# Patient Record
Sex: Male | Born: 1940 | Race: White | Hispanic: No | Marital: Married | Smoking: Former smoker
Health system: Southern US, Community
[De-identification: ages and names within clinical notes are randomized; demographics above are authoritative.]

## PROBLEM LIST (undated history)

## (undated) DIAGNOSIS — A0472 Enterocolitis due to Clostridium difficile, not specified as recurrent: Secondary | ICD-10-CM

## (undated) DIAGNOSIS — E785 Hyperlipidemia, unspecified: Secondary | ICD-10-CM

## (undated) DIAGNOSIS — K219 Gastro-esophageal reflux disease without esophagitis: Secondary | ICD-10-CM

## (undated) DIAGNOSIS — F321 Major depressive disorder, single episode, moderate: Secondary | ICD-10-CM

## (undated) HISTORY — DX: Enterocolitis due to Clostridium difficile, not specified as recurrent: A04.72

## (undated) HISTORY — DX: Hyperlipidemia, unspecified: E78.5

## (undated) HISTORY — DX: Major depressive disorder, single episode, moderate: F32.1

## (undated) HISTORY — DX: Gastro-esophageal reflux disease without esophagitis: K21.9

## (undated) HISTORY — PX: PROSTATECTOMY: SHX69

## (undated) HISTORY — PX: OTHER SURGICAL HISTORY: SHX169

---

## 2012-11-24 ENCOUNTER — Ambulatory Visit (INDEPENDENT_AMBULATORY_CARE_PROVIDER_SITE_OTHER): Payer: BC Managed Care – PPO | Admitting: Sports Medicine

## 2012-11-24 ENCOUNTER — Encounter: Payer: Self-pay | Admitting: Sports Medicine

## 2012-11-24 VITALS — BP 125/76 | HR 74 | Wt 197.0 lb

## 2012-11-24 DIAGNOSIS — Z23 Encounter for immunization: Secondary | ICD-10-CM

## 2012-11-24 DIAGNOSIS — G4700 Insomnia, unspecified: Secondary | ICD-10-CM

## 2012-11-24 DIAGNOSIS — Z299 Encounter for prophylactic measures, unspecified: Secondary | ICD-10-CM

## 2012-11-24 DIAGNOSIS — Z9079 Acquired absence of other genital organ(s): Secondary | ICD-10-CM

## 2012-11-24 DIAGNOSIS — Z0000 Encounter for general adult medical examination without abnormal findings: Secondary | ICD-10-CM | POA: Insufficient documentation

## 2012-11-24 DIAGNOSIS — N139 Obstructive and reflux uropathy, unspecified: Secondary | ICD-10-CM | POA: Insufficient documentation

## 2012-11-24 DIAGNOSIS — E785 Hyperlipidemia, unspecified: Secondary | ICD-10-CM

## 2012-11-24 DIAGNOSIS — L219 Seborrheic dermatitis, unspecified: Secondary | ICD-10-CM

## 2012-11-24 MED ORDER — TAMSULOSIN HCL 0.4 MG PO CAPS: 0.4000 mg | ORAL_CAPSULE | Freq: Every day | ORAL | Status: DC

## 2012-11-24 MED ORDER — TRIAMCINOLONE ACETONIDE 0.5 % EX CREA: TOPICAL_CREAM | Freq: Two times a day (BID) | CUTANEOUS | Status: DC

## 2012-11-24 MED ORDER — ZOLPIDEM TARTRATE 10 MG PO TABS: 10.0000 mg | ORAL_TABLET | Freq: Every evening | ORAL | Status: DC | PRN

## 2012-11-24 MED ORDER — TADALAFIL 5 MG PO TABS: 5.0000 mg | ORAL_TABLET | Freq: Every day | ORAL | Status: DC

## 2012-11-24 NOTE — Assessment & Plan Note (Addendum)
Now with some erectile dysfunction, and obstructive uropathy. Adding Flomax, checking PSA. Prescription for Cialis with discount coupon given. Return in one month.

## 2012-11-24 NOTE — Progress Notes (Signed)
  Subjective:    CC: Establish care.   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  Rash on face: Pruritic, localized over the eyebrows, and nasolabial folds. Moderate, persistent.  Insomnia: Well controlled in the past with Ambien, his prior PCP would not prescribe this. He is aware of the risks and benefits of the use of Ambien in an older individual. He does continue to desire to proceed.  Nocturia: Difficult stream, dribbling, poor bladder routine, and getting up multiple times through the night to urinate.  History of radical prostatectomy: surgical cure per patient report of his urologist's assessment. Still has some difficulty achieving erections.  Preventive measure: Desires flu, tetanus, pneumococcal, and shingles vaccine today.  Past medical history, Surgical history, Family history not pertinant except as noted below, Social history, Allergies, and medications have been entered into the medical record, reviewed, and no changes needed.   Review of Systems: No headache, visual changes, nausea, vomiting, diarrhea, constipation, dizziness, abdominal pain, skin rash, fevers, chills, night sweats, swollen lymph nodes, weight loss, chest pain, body aches, joint swelling, muscle aches, shortness of breath, mood changes, visual or auditory hallucinations.  Objective:    General: Well Developed, well nourished, and in no acute distress.  Neuro: Alert and oriented x3, extra-ocular muscles intact, sensation grossly intact.  HEENT: Normocephalic, atraumatic, pupils equal round reactive to light, neck supple, no masses, no lymphadenopathy, thyroid nonpalpable.  Skin: Warm and dry, seborrheic dermatitis noted over the forehead. Cardiac: Regular rate and rhythm, no murmurs rubs or gallops.  Respiratory: Clear to auscultation bilaterally. Not using  accessory muscles, speaking in full sentences.  Abdominal: Soft, nontender, nondistended, positive bowel sounds, no masses, no organomegaly.  Musculoskeletal: Shoulder, elbow, wrist, hip, knee, ankle stable, and with full range of motion.  Impression and Recommendations:    The patient was counselled, risk factors were discussed, anticipatory guidance given.

## 2012-11-24 NOTE — Assessment & Plan Note (Signed)
Restarting Ambien. 

## 2012-11-24 NOTE — Assessment & Plan Note (Signed)
Tetanus, flu, pneumonia, shingles vaccine was given today.

## 2012-11-24 NOTE — Assessment & Plan Note (Signed)
Present on forehead, and around eyes. Kenalog cream. Return as needed for this.

## 2012-11-24 NOTE — Assessment & Plan Note (Signed)
Rechecking lipids, it sounds as from his history he has a familial form of hyperlipidemia.

## 2012-11-25 LAB — TESTOSTERONE, FREE, TOTAL, SHBG
Sex Hormone Binding: 87 nmol/L — ABNORMAL HIGH (ref 13–71)
Testosterone, Free: 87.8 pg/mL (ref 47.0–244.0)
Testosterone-% Free: 1.1 % — ABNORMAL LOW (ref 1.6–2.9)
Testosterone: 792 ng/dL (ref 300–890)

## 2012-11-25 LAB — CBC
HCT: 41.4 % (ref 39.0–52.0)
Hemoglobin: 14.5 g/dL (ref 13.0–17.0)
MCH: 36.4 pg — ABNORMAL HIGH (ref 26.0–34.0)
MCHC: 35 g/dL (ref 30.0–36.0)
MCV: 104 fL — ABNORMAL HIGH (ref 78.0–100.0)
Platelets: 261 K/uL (ref 150–400)
RBC: 3.98 MIL/uL — ABNORMAL LOW (ref 4.22–5.81)
RDW: 14.6 % (ref 11.5–15.5)
WBC: 3.9 K/uL — ABNORMAL LOW (ref 4.0–10.5)

## 2012-11-25 LAB — LIPID PANEL
Cholesterol: 149 mg/dL (ref 0–200)
HDL: 54 mg/dL (ref >39)
LDL Cholesterol: 69 mg/dL (ref 0–99)
Total CHOL/HDL Ratio: 2.8 ratio
Triglycerides: 130 mg/dL (ref <150)
VLDL: 26 mg/dL (ref 0–40)

## 2012-11-25 LAB — COMPREHENSIVE METABOLIC PANEL WITH GFR
ALT: 14 U/L (ref 0–53)
CO2: 27 meq/L (ref 19–32)
Calcium: 9.4 mg/dL (ref 8.4–10.5)
Chloride: 105 meq/L (ref 96–112)
Sodium: 140 meq/L (ref 135–145)
Total Bilirubin: 0.8 mg/dL (ref 0.3–1.2)
Total Protein: 6.9 g/dL (ref 6.0–8.3)

## 2012-11-25 LAB — PSA, TOTAL AND FREE
PSA, Free: <0.01 ng/mL
PSA: <0.01 ng/mL

## 2012-11-25 LAB — COMPREHENSIVE METABOLIC PANEL
AST: 19 U/L (ref 0–37)
Albumin: 4.3 g/dL (ref 3.5–5.2)
Alkaline Phosphatase: 85 U/L (ref 39–117)
BUN: 11 mg/dL (ref 6–23)
Creat: 0.85 mg/dL (ref 0.50–1.35)
Glucose, Bld: 88 mg/dL (ref 70–99)
Potassium: 4.6 mEq/L (ref 3.5–5.3)

## 2012-12-26 ENCOUNTER — Ambulatory Visit (INDEPENDENT_AMBULATORY_CARE_PROVIDER_SITE_OTHER): Payer: BC Managed Care – PPO | Admitting: Sports Medicine

## 2012-12-26 ENCOUNTER — Encounter: Payer: Self-pay | Admitting: Sports Medicine

## 2012-12-26 VITALS — BP 118/63 | HR 87 | Wt 192.0 lb

## 2012-12-26 DIAGNOSIS — Z9079 Acquired absence of other genital organ(s): Secondary | ICD-10-CM

## 2012-12-26 DIAGNOSIS — E785 Hyperlipidemia, unspecified: Secondary | ICD-10-CM

## 2012-12-26 DIAGNOSIS — L219 Seborrheic dermatitis, unspecified: Secondary | ICD-10-CM

## 2012-12-26 DIAGNOSIS — G4700 Insomnia, unspecified: Secondary | ICD-10-CM

## 2012-12-26 MED ORDER — ZOLPIDEM TARTRATE 10 MG PO TABS: 10.0000 mg | ORAL_TABLET | Freq: Every evening | ORAL | Status: DC | PRN

## 2012-12-26 MED ORDER — GEMFIBROZIL 600 MG PO TABS: 600.0000 mg | ORAL_TABLET | Freq: Two times a day (BID) | ORAL | Status: DC

## 2012-12-26 MED ORDER — TAMSULOSIN HCL 0.4 MG PO CAPS: 0.8000 mg | ORAL_CAPSULE | Freq: Every day | ORAL | Status: DC

## 2012-12-26 NOTE — Assessment & Plan Note (Signed)
Zero response to Cialis. Increasing Flomax to 2 tabs at dinner time.

## 2012-12-26 NOTE — Assessment & Plan Note (Signed)
Refilling this.

## 2012-12-26 NOTE — Assessment & Plan Note (Signed)
Resolved with steroid cream.

## 2012-12-26 NOTE — Progress Notes (Signed)
  Subjective:    CC: Followup  HPI: Insomnia: Well controlled.  Obstructive uropathy:  No response to Cialis, still doing okay with occasional Flomax one tab in the evening.  Seborrheic dermatitis: Resolved with steroid cream.  Past medical history, Surgical history, Family history not pertinant except as noted below, Social history, Allergies, and medications have been entered into the medical record, reviewed, and no changes needed.   Review of Systems: No fevers, chills, night sweats, weight loss, chest pain, or shortness of breath.   Objective:    General: Well Developed, well nourished, and in no acute distress.  Neuro: Alert and oriented x3, extra-ocular muscles intact, sensation grossly intact.  HEENT: Normocephalic, atraumatic, pupils equal round reactive to light, neck supple, no masses, no lymphadenopathy, thyroid nonpalpable.  Skin: Warm and dry, no rashes. Cardiac: Regular rate and rhythm, no murmurs rubs or gallops, no lower extremity edema.  Respiratory: Clear to auscultation bilaterally. Not using accessory muscles, speaking in full sentences.  Impression and Recommendations:

## 2012-12-26 NOTE — Assessment & Plan Note (Signed)
Refilling Ambien. He needs 30 pills for now, and an additional 3 months sent to [facility].

## 2013-01-16 ENCOUNTER — Other Ambulatory Visit: Payer: Self-pay | Admitting: Sports Medicine

## 2013-03-06 ENCOUNTER — Ambulatory Visit (INDEPENDENT_AMBULATORY_CARE_PROVIDER_SITE_OTHER): Payer: BC Managed Care – PPO | Admitting: Sports Medicine

## 2013-03-06 ENCOUNTER — Encounter: Payer: Self-pay | Admitting: Sports Medicine

## 2013-03-06 VITALS — BP 132/72 | HR 78 | Ht 73.0 in | Wt 198.0 lb

## 2013-03-06 DIAGNOSIS — C44212 Basal cell carcinoma of skin of right ear and external auricular canal: Secondary | ICD-10-CM | POA: Insufficient documentation

## 2013-03-06 DIAGNOSIS — L989 Disorder of the skin and subcutaneous tissue, unspecified: Secondary | ICD-10-CM

## 2013-03-06 DIAGNOSIS — J0100 Acute maxillary sinusitis, unspecified: Secondary | ICD-10-CM | POA: Insufficient documentation

## 2013-03-06 MED ORDER — DOXYCYCLINE HYCLATE 100 MG PO TABS: 100.0000 mg | ORAL_TABLET | Freq: Two times a day (BID) | ORAL | Status: DC

## 2013-03-06 MED ORDER — FLUTICASONE PROPIONATE 50 MCG/ACT NA SUSP: NASAL | Status: DC

## 2013-03-06 NOTE — Assessment & Plan Note (Signed)
This occurred while gardening, he thinks he was poked by a thorn. Clinically this resembles a cellulitis. Doxycycline should cover his sinus infection and skin and soft tissue infection. We certainly do need to keep a rare fungal etiologies in our head such as sporotrichosis. If persistent we will probably pursue excisional biopsy.

## 2013-03-06 NOTE — Progress Notes (Signed)
  Subjective:    CC: Lesion on arm  HPI: This is a very pleasant 59 her old male, a week ago he was doing some gardening, thinks he got stuck with a poor, and developed a painful, reddish lesion on the dorsum of his left forearm. Symptoms are fairly persistent. No fevers, chills, no constitutional symptoms, no other complaints. Pain is moderate, no radiation.  Sinus infection: Increasing pressure and pain over the maxillary sinuses with radiation to the left ear.  Past medical history, Surgical history, Family history not pertinant except as noted below, Social history, Allergies, and medications have been entered into the medical record, reviewed, and no changes needed.   Review of Systems: No fevers, chills, night sweats, weight loss, chest pain, or shortness of breath.   Objective:    General: Well Developed, well nourished, and in no acute distress.  Neuro: Alert and oriented x3, extra-ocular muscles intact, sensation grossly intact.  HEENT: Normocephalic, atraumatic, pupils equal round reactive to light, neck supple, no masses, no lymphadenopathy, thyroid nonpalpable. Oropharynx, nasopharynx, external ear canals are unremarkable. Skin: Warm and dry, no rashes. There is a 1 cm papule with surrounding erythema, induration, and tenderness to palpation. This is localized on the left dorsal forearm Cardiac: Regular rate and rhythm, no murmurs rubs or gallops, no lower extremity edema.  Respiratory: Clear to auscultation bilaterally. Not using accessory muscles, speaking in full sentences.  Impression and Recommendations:

## 2013-03-06 NOTE — Assessment & Plan Note (Signed)
Doxycycline, Flonase.  

## 2013-03-13 ENCOUNTER — Telehealth: Payer: Self-pay

## 2013-03-13 ENCOUNTER — Ambulatory Visit (INDEPENDENT_AMBULATORY_CARE_PROVIDER_SITE_OTHER): Payer: BC Managed Care – PPO | Admitting: Sports Medicine

## 2013-03-13 ENCOUNTER — Encounter: Payer: Self-pay | Admitting: Sports Medicine

## 2013-03-13 VITALS — BP 115/70 | HR 69 | Ht 73.0 in | Wt 195.0 lb

## 2013-03-13 DIAGNOSIS — L989 Disorder of the skin and subcutaneous tissue, unspecified: Secondary | ICD-10-CM

## 2013-03-13 DIAGNOSIS — R21 Rash and other nonspecific skin eruption: Secondary | ICD-10-CM

## 2013-03-13 MED ORDER — HYDROCODONE-ACETAMINOPHEN 5-325 MG PO TABS: 1.0000 | ORAL_TABLET | Freq: Three times a day (TID) | ORAL | Status: DC | PRN

## 2013-03-13 NOTE — Addendum Note (Signed)
Addended by: Silverio Decamp on: 03/13/2013 02:52 PM   Modules accepted: Orders

## 2013-03-13 NOTE — Progress Notes (Signed)
  Subjective:    CC: Followup  HPI: Acute sinusitis: Resolved.  Skin rash: On left arm, improved, he still does have a scaly lesion over the dorsum of the forearm.  Past medical history, Surgical history, Family history not pertinant except as noted below, Social history, Allergies, and medications have been entered into the medical record, reviewed, and no changes needed.   Review of Systems: No fevers, chills, night sweats, weight loss, chest pain, or shortness of breath.   Objective:    General: Well Developed, well nourished, and in no acute distress.  Neuro: Alert and oriented x3, extra-ocular muscles intact, sensation grossly intact.  HEENT: Normocephalic, atraumatic, pupils equal round reactive to light, neck supple, no masses, no lymphadenopathy, thyroid nonpalpable.  Skin: Warm and dry, there is a 1.1 cm scaly lesion over the dorsum of the left forearm. Cardiac: Regular rate and rhythm, no murmurs rubs or gallops, no lower extremity edema.  Respiratory: Clear to auscultation bilaterally. Not using accessory muscles, speaking in full sentences.  Procedure:  Excision of  left forearm skin lesion, possibly malignant Risks, benefits, and alternatives explained and consent obtained. Time out conducted. Surface prepped with alcohol. 5cc lidocaine with epinephine infiltrated in a field block. Adequate anesthesia ensured. Area prepped and draped in a sterile fashion. Excision performed with: An elliptical incision was made around the lesion leaving a 0.5 cm margin on either side. The lesion was fully excised down to the subcutaneous tissue, it was sent off for pathology, I did place a single suture on the ulnar aspect. It includes the incision with a 4-0 Ethilon horizontal mattress to reduce tension across the edges, I didn't at least 4-0 Vicryl sutures in a running subcuticular pattern. Hemostasis achieved. Pt stable.  Impression and Recommendations:    I spent 40 minutes with  this patient, greater than 50% was face-to-face time counseling regarding the above diagnosis.

## 2013-03-13 NOTE — Assessment & Plan Note (Signed)
Erythema did improve with doxycycline however the lesion still is present. I do have a concern for squamous cell carcinoma. Excisional biopsy performed. Patient will return in one week for removal of the horizontal mattress reinforcing suture.

## 2013-03-13 NOTE — Telephone Encounter (Signed)
Prescription is on your desk.

## 2013-03-13 NOTE — Telephone Encounter (Signed)
Patient was in office today for a biopsy, he is requesting a pain medication sent to his pharmacy. Irais Mottram,CMA

## 2013-03-20 ENCOUNTER — Ambulatory Visit (INDEPENDENT_AMBULATORY_CARE_PROVIDER_SITE_OTHER): Payer: BC Managed Care – PPO | Admitting: Sports Medicine

## 2013-03-20 ENCOUNTER — Encounter: Payer: Self-pay | Admitting: Sports Medicine

## 2013-03-20 VITALS — BP 118/71 | HR 70 | Ht 73.0 in | Wt 198.0 lb

## 2013-03-20 DIAGNOSIS — L989 Disorder of the skin and subcutaneous tissue, unspecified: Secondary | ICD-10-CM

## 2013-03-20 MED ORDER — DOXYCYCLINE HYCLATE 100 MG PO TABS: 100.0000 mg | ORAL_TABLET | Freq: Two times a day (BID) | ORAL | Status: AC

## 2013-03-20 NOTE — Progress Notes (Signed)
  Subjective:    CC:  Wound check  HPI: Approximately one week ago I removed a lesion from Christian Lambert's left forearm, I placed a horizontal mattress suture to remove tension on the wound, I then approximated the edges of the incision with a running subcuticular stitch. Overall he feels good, pathology results will be dictated below.  Past medical history, Surgical history, Family history not pertinant except as noted below, Social history, Allergies, and medications have been entered into the medical record, reviewed, and no changes needed.   Review of Systems: No fevers, chills, night sweats, weight loss, chest pain, or shortness of breath.   Objective:    General: Well Developed, well nourished, and in no acute distress.  Neuro: Alert and oriented x3, extra-ocular muscles intact, sensation grossly intact.  HEENT: Normocephalic, atraumatic, pupils equal round reactive to light, neck supple, no masses, no lymphadenopathy, thyroid nonpalpable.  Skin: Warm and dry, no rashes. Unfortunately there does appear to be some dehiscence of the wound, there is mild erythema but no exudates or overt purulence. I cleaned the wound extensively and debrided any necrotic tissue, I then removed the horizontal mattress suture, I then placed Steri-Strips to further approximate the edges of.  Cardiac: Regular rate and rhythm, no murmurs rubs or gallops, no lower extremity edema.  Respiratory: Clear to auscultation bilaterally. Not using accessory muscles, speaking in full sentences.  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  Impression and Recommendations:

## 2013-03-20 NOTE — Assessment & Plan Note (Signed)
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 The wound does show a very small amount of dehiscence. I placed Steri-Strips and divided the wound is necrotic tissue. He'll come back to see me in one week, I am giving him a single additional course of doxycycline as there was mild erythema.

## 2013-03-28 ENCOUNTER — Ambulatory Visit (INDEPENDENT_AMBULATORY_CARE_PROVIDER_SITE_OTHER): Payer: BC Managed Care – PPO | Admitting: Sports Medicine

## 2013-03-28 ENCOUNTER — Encounter: Payer: Self-pay | Admitting: Sports Medicine

## 2013-03-28 VITALS — BP 103/62 | HR 65 | Temp 97.9°F | Ht 73.0 in | Wt 199.0 lb

## 2013-03-28 DIAGNOSIS — L989 Disorder of the skin and subcutaneous tissue, unspecified: Secondary | ICD-10-CM

## 2013-03-28 NOTE — Assessment & Plan Note (Signed)
Doing well post excision. Return as needed.

## 2013-03-28 NOTE — Progress Notes (Signed)
  Subjective:    CC: Wound check  HPI: Excisional biopsy performed, results of the being chronic folliculitis and nothing malignant. He returned he did have some dehiscence of the wound and erythema suggestive of mild infection. I debrided the wound, placed Steri-Strips, and provided an antibiotic. He returns today with wound completely healed and symptoms totally resolved.  Past medical history, Surgical history, Family history not pertinant except as noted below, Social history, Allergies, and medications have been entered into the medical record, reviewed, and no changes needed.   Review of Systems: No fevers, chills, night sweats, weight loss, chest pain, or shortness of breath.   Objective:    General: Well Developed, well nourished, and in no acute distress.  Neuro: Alert and oriented x3, extra-ocular muscles intact, sensation grossly intact.  HEENT: Normocephalic, atraumatic, pupils equal round reactive to light, neck supple, no masses, no lymphadenopathy, thyroid nonpalpable.  Skin: Warm and dry, no rashes. Incision is clean, dry, intact, and well-healed. Cardiac: Regular rate and rhythm, no murmurs rubs or gallops, no lower extremity edema.  Respiratory: Clear to auscultation bilaterally. Not using accessory muscles, speaking in full sentences.  Impression and Recommendations:

## 2013-07-21 ENCOUNTER — Other Ambulatory Visit: Payer: Self-pay | Admitting: Sports Medicine

## 2013-07-21 ENCOUNTER — Other Ambulatory Visit: Payer: Self-pay

## 2013-08-14 ENCOUNTER — Encounter: Payer: Self-pay | Admitting: Sports Medicine

## 2013-08-14 ENCOUNTER — Ambulatory Visit (INDEPENDENT_AMBULATORY_CARE_PROVIDER_SITE_OTHER): Payer: BC Managed Care – PPO | Admitting: Sports Medicine

## 2013-08-14 VITALS — BP 118/75 | HR 81 | Ht 73.0 in | Wt 189.0 lb

## 2013-08-14 DIAGNOSIS — Z299 Encounter for prophylactic measures, unspecified: Secondary | ICD-10-CM

## 2013-08-14 DIAGNOSIS — G4700 Insomnia, unspecified: Secondary | ICD-10-CM

## 2013-08-14 DIAGNOSIS — IMO0002 Reserved for concepts with insufficient information to code with codable children: Secondary | ICD-10-CM

## 2013-08-14 DIAGNOSIS — S76312A Strain of muscle, fascia and tendon of the posterior muscle group at thigh level, left thigh, initial encounter: Secondary | ICD-10-CM | POA: Insufficient documentation

## 2013-08-14 DIAGNOSIS — D7589 Other specified diseases of blood and blood-forming organs: Secondary | ICD-10-CM

## 2013-08-14 MED ORDER — ZOLPIDEM TARTRATE 10 MG PO TABS: 10.0000 mg | ORAL_TABLET | Freq: Every day | ORAL | Status: DC

## 2013-08-14 MED ORDER — MELOXICAM 15 MG PO TABS: ORAL_TABLET | ORAL | Status: DC

## 2013-08-14 NOTE — Assessment & Plan Note (Addendum)
Mobic, formal physical therapy, strapped with compressive dressing. As there is a slight concern for DVT, we are going to obtain a d-dimer. Return in one month.

## 2013-08-14 NOTE — Assessment & Plan Note (Signed)
Needs refill on Ambien  

## 2013-08-14 NOTE — Progress Notes (Signed)
  Subjective:    CC: Left thigh pain  HPI: Christian Lambert is a very pleasant 73 year old male, he drives a truck, for the past several weeks he's had pain he localizes on the posterior aspect of his left thigh, moderate, persistent, pain is localized without radiation. He denies any trauma. His principle concern is that he has a blood clot, he denies any lower extremity swelling, chest pain, or shortness of breath. No clearly radicular symptoms, no back pain.  Past medical history, Surgical history, Family history not pertinant except as noted below, Social history, Allergies, and medications have been entered into the medical record, reviewed, and no changes needed.   Review of Systems: No fevers, chills, night sweats, weight loss, chest pain, or shortness of breath.   Objective:    General: Well Developed, well nourished, and in no acute distress.  Neuro: Alert and oriented x3, extra-ocular muscles intact, sensation grossly intact.  HEENT: Normocephalic, atraumatic, pupils equal round reactive to light, neck supple, no masses, no lymphadenopathy, thyroid nonpalpable.  Skin: Warm and dry, no rashes. Cardiac: Regular rate and rhythm, no murmurs rubs or gallops, no lower extremity edema.  Respiratory: Clear to auscultation bilaterally. Not using accessory muscles, speaking in full sentences. Left Hip: ROM IR: 60 Deg, ER: 60 Deg, Flexion: 120 Deg, Extension: 100 Deg, Abduction: 45 Deg, Adduction: 45 Deg Strength IR: 5/5, ER: 5/5, Flexion: 5/5, Extension: 5/5, Abduction: 5/5, Adduction: 5/5 Pelvic alignment unremarkable to inspection and palpation. Standing hip rotation and gait without trendelenburg / unsteadiness. Greater trochanter without tenderness to palpation. No tenderness over piriformis. No SI joint tenderness and normal minimal SI movement. Discrete tenderness to palpation over the semimembranosus and semitendinosus muscle bellies, with reproduction of pain with resisted knee  flexion.  The entire thigh and knee were strapped with compressive dressing. This resolved his pain.  Impression and Recommendations:

## 2013-08-14 NOTE — Assessment & Plan Note (Signed)
Adding routine blood work. 

## 2013-08-15 LAB — TSH: TSH: 1.057 u[IU]/mL (ref 0.350–4.500)

## 2013-08-15 LAB — COMPREHENSIVE METABOLIC PANEL
ALT: 15 U/L (ref 0–53)
Albumin: 4.4 g/dL (ref 3.5–5.2)
Alkaline Phosphatase: 78 U/L (ref 39–117)
BUN: 13 mg/dL (ref 6–23)
CO2: 24 mEq/L (ref 19–32)
Calcium: 9.2 mg/dL (ref 8.4–10.5)
Chloride: 108 mEq/L (ref 96–112)
Creat: 0.85 mg/dL (ref 0.50–1.35)
Glucose, Bld: 91 mg/dL (ref 70–99)
Potassium: 4.1 mEq/L (ref 3.5–5.3)
Sodium: 141 mEq/L (ref 135–145)
Total Bilirubin: 0.9 mg/dL (ref 0.2–1.2)
Total Protein: 6.6 g/dL (ref 6.0–8.3)

## 2013-08-15 LAB — CBC
HCT: 41 % (ref 39.0–52.0)
Hemoglobin: 14.2 g/dL (ref 13.0–17.0)
MCH: 35.9 pg — ABNORMAL HIGH (ref 26.0–34.0)
MCHC: 34.6 g/dL (ref 30.0–36.0)
MCV: 103.5 fL — ABNORMAL HIGH (ref 78.0–100.0)
Platelets: 222 10*3/uL (ref 150–400)
RBC: 3.96 MIL/uL — ABNORMAL LOW (ref 4.22–5.81)
RDW: 15.7 % — ABNORMAL HIGH (ref 11.5–15.5)
WBC: 4.5 10*3/uL (ref 4.0–10.5)

## 2013-08-15 LAB — LIPID PANEL
Cholesterol: 137 mg/dL (ref 0–200)
HDL: 57 mg/dL (ref >39)
LDL Cholesterol: 67 mg/dL (ref 0–99)
Total CHOL/HDL Ratio: 2.4 Ratio
Triglycerides: 67 mg/dL (ref <150)
VLDL: 13 mg/dL (ref 0–40)

## 2013-08-15 LAB — D-DIMER, QUANTITATIVE: D-Dimer, Quant: 0.32 ug{FEU}/mL (ref 0.00–0.48)

## 2013-08-15 LAB — COMPREHENSIVE METABOLIC PANEL WITH GFR: AST: 19 U/L (ref 0–37)

## 2013-08-15 LAB — HEMOGLOBIN A1C
Hgb A1c MFr Bld: 5.2 % (ref <5.7)
Mean Plasma Glucose: 103 mg/dL (ref <117)

## 2013-08-15 NOTE — Addendum Note (Signed)
Addended by: Silverio Decamp on: 08/15/2013 10:51 AM   Modules accepted: Orders

## 2013-08-28 ENCOUNTER — Ambulatory Visit (INDEPENDENT_AMBULATORY_CARE_PROVIDER_SITE_OTHER): Payer: BC Managed Care – PPO | Admitting: Physical Therapy

## 2013-08-28 DIAGNOSIS — M25569 Pain in unspecified knee: Secondary | ICD-10-CM

## 2013-08-28 DIAGNOSIS — M6281 Muscle weakness (generalized): Secondary | ICD-10-CM

## 2013-08-28 DIAGNOSIS — IMO0002 Reserved for concepts with insufficient information to code with codable children: Secondary | ICD-10-CM

## 2013-09-11 ENCOUNTER — Ambulatory Visit: Payer: BC Managed Care – PPO | Admitting: Sports Medicine

## 2013-09-14 ENCOUNTER — Ambulatory Visit (INDEPENDENT_AMBULATORY_CARE_PROVIDER_SITE_OTHER): Payer: BC Managed Care – PPO | Admitting: Family Medicine

## 2013-09-14 ENCOUNTER — Encounter: Payer: Self-pay | Admitting: Family Medicine

## 2013-09-14 VITALS — BP 118/75 | HR 73 | Temp 98.1°F | Wt 189.0 lb

## 2013-09-14 DIAGNOSIS — W5501XA Bitten by cat, initial encounter: Secondary | ICD-10-CM

## 2013-09-14 DIAGNOSIS — IMO0001 Reserved for inherently not codable concepts without codable children: Secondary | ICD-10-CM

## 2013-09-14 DIAGNOSIS — T148XXA Other injury of unspecified body region, initial encounter: Secondary | ICD-10-CM

## 2013-09-14 MED ORDER — AMOXICILLIN-POT CLAVULANATE 500-125 MG PO TABS
ORAL_TABLET | ORAL | Status: AC
Start: 2013-09-14 — End: 2013-09-24

## 2013-09-14 NOTE — Progress Notes (Signed)
CC: Christian Lambert is a 73 y.o. male is here for cat scratch   Subjective: HPI:  Complains of pain, redness, swelling in his right hand at the base of the thumb that began one day ago. History significant for cat bite that occurred 48 hours ago. This cath was his domesticated cat is up-to-date on shots and rabies vaccinations, the cat bit him while the patient was trying to put it in a transportation been. This report scant bleeding that was stopped within seconds after light pressure 48 hours ago. Pain is described as a soreness, warmth, and mild in severity that seems to be accompanied by a redness that is expanding. Overall he states that he is in his regular state of health without fevers, chills, swollen lymph nodes, rapid heart beat nor nausea.  Review Of Systems Outlined In HPI  Past Medical History  Diagnosis Date  . Hyperlipidemia     Past Surgical History  Procedure Laterality Date  . Prostatectomy    . Broken leg     Family History  Problem Relation Age of Onset  . Alcohol abuse Mother   . Cancer Mother   . Depression Mother   . Alcohol abuse Father   . Cancer Maternal Aunt   . Alcohol abuse Maternal Uncle   . Cancer Maternal Uncle   . Diabetes Maternal Grandmother     History   Social History  . Marital Status: Married    Spouse Name: N/A    Number of Children: N/A  . Years of Education: N/A   Occupational History  . Not on file.   Social History Main Topics  . Smoking status: Former Research scientist (life sciences)  . Smokeless tobacco: Not on file  . Alcohol Use: No  . Drug Use: Not on file  . Sexual Activity: Not on file   Other Topics Concern  . Not on file   Social History Narrative  . No narrative on file     Objective: BP 118/75  Pulse 73  Temp(Src) 98.1 F (36.7 C) (Oral)  Wt 189 lb (85.73 kg)  Vital signs reviewed. General: Alert and Oriented, No Acute Distress HEENT: Pupils equal, round, reactive to light. Conjunctivae clear.  External ears unremarkable.   Moist mucous membranes. Lungs: Clear and comfortable work of breathing, speaking in full sentences without accessory muscle use. Cardiac: Regular rate and rhythm.  Neuro: CN II-XII grossly intact, gait normal. Extremities: No peripheral edema.  Strong peripheral pulses.  Mental Status: No depression, anxiety, nor agitation. Logical though process. Skin: Warm and dry. At the base of the right thumb there is a nontender puncture wound in the thenar region and on the dorsal aspect there is another puncture wound with moderate erythema treating the periphery of 2 cm in diameter warm to the touch compared to surrounding skin.  Assessment & Plan: Christian Lambert was seen today for cat scratch.  Diagnoses and associated orders for this visit:  Cat bite, initial encounter - amoxicillin-clavulanate (AUGMENTIN) 500-125 MG per tablet; Take one by mouth every 8 hours for ten total days.    Cat bite: Tetanus is up-to-date, start Augmentin,Signs and symptoms requring emergent/urgent reevaluation were discussed with the patient. He requested that his recent B12, iron studies etc. be reprinted which I gave him today.  Return if symptoms worsen or fail to improve.

## 2013-09-15 LAB — IRON AND TIBC
%SAT: 56 % — ABNORMAL HIGH (ref 20–55)
Iron: 230 ug/dL — ABNORMAL HIGH (ref 42–165)
TIBC: 413 ug/dL (ref 215–435)
UIBC: 183 ug/dL (ref 125–400)

## 2013-09-15 LAB — VITAMIN B12: Vitamin B-12: 359 pg/mL (ref 211–911)

## 2013-09-15 LAB — FOLATE: Folate: >20 ng/mL

## 2013-09-15 LAB — FERRITIN: Ferritin: 213 ng/mL (ref 22–322)

## 2013-11-09 ENCOUNTER — Other Ambulatory Visit: Payer: Self-pay | Admitting: Sports Medicine

## 2013-11-09 ENCOUNTER — Encounter: Payer: Self-pay | Admitting: Sports Medicine

## 2013-11-09 ENCOUNTER — Ambulatory Visit (INDEPENDENT_AMBULATORY_CARE_PROVIDER_SITE_OTHER): Payer: BC Managed Care – PPO

## 2013-11-09 ENCOUNTER — Ambulatory Visit (INDEPENDENT_AMBULATORY_CARE_PROVIDER_SITE_OTHER): Payer: BC Managed Care – PPO | Admitting: Sports Medicine

## 2013-11-09 VITALS — BP 123/77 | HR 76 | Ht 73.0 in | Wt 187.0 lb

## 2013-11-09 DIAGNOSIS — G4700 Insomnia, unspecified: Secondary | ICD-10-CM | POA: Diagnosis not present

## 2013-11-09 DIAGNOSIS — Z418 Encounter for other procedures for purposes other than remedying health state: Secondary | ICD-10-CM | POA: Diagnosis not present

## 2013-11-09 DIAGNOSIS — R197 Diarrhea, unspecified: Secondary | ICD-10-CM

## 2013-11-09 DIAGNOSIS — A0472 Enterocolitis due to Clostridium difficile, not specified as recurrent: Secondary | ICD-10-CM | POA: Insufficient documentation

## 2013-11-09 DIAGNOSIS — Z23 Encounter for immunization: Secondary | ICD-10-CM

## 2013-11-09 DIAGNOSIS — R1032 Left lower quadrant pain: Secondary | ICD-10-CM

## 2013-11-09 DIAGNOSIS — Z299 Encounter for prophylactic measures, unspecified: Secondary | ICD-10-CM

## 2013-11-09 HISTORY — DX: Enterocolitis due to Clostridium difficile, not specified as recurrent: A04.72

## 2013-11-09 MED ORDER — ZOLPIDEM TARTRATE 10 MG PO TABS: 10.0000 mg | ORAL_TABLET | Freq: Every day | ORAL | Status: DC

## 2013-11-09 MED ORDER — HYOSCYAMINE SULFATE 0.125 MG PO TABS
0.1250 mg | ORAL_TABLET | ORAL | Status: DC | PRN
Start: 2013-11-09 — End: 2013-12-12

## 2013-11-09 NOTE — Assessment & Plan Note (Signed)
Symptoms are highly suggestive of IBS however he doesn't history of dysentery. Checking some blood work, stool studies. Levsin for symptomatic relief KUB. Return in 4 weeks.

## 2013-11-09 NOTE — Progress Notes (Signed)
  Subjective:    CC: Followup  HPI: Insomnia: Well controlled with Ambien, needs a refill.  Preventative measures: Due for colonoscopy.  Diarrhea: Present for several months, occasionally watery without blood, or melena. No nausea, no relation to foods. No recent travel outside the country, no camping trips. No constitutional symptoms, no rash. Gets episodes of tenesmus with improvement with stooling.  Past medical history, Surgical history, Family history not pertinant except as noted below, Social history, Allergies, and medications have been entered into the medical record, reviewed, and no changes needed.   Review of Systems: No fevers, chills, night sweats, weight loss, chest pain, or shortness of breath.   Objective:    General: Well Developed, well nourished, and in no acute distress.  Neuro: Alert and oriented x3, extra-ocular muscles intact, sensation grossly intact.  HEENT: Normocephalic, atraumatic, pupils equal round reactive to light, neck supple, no masses, no lymphadenopathy, thyroid nonpalpable.  Skin: Warm and dry, no rashes. Cardiac: Regular rate and rhythm, no murmurs rubs or gallops, no lower extremity edema.  Respiratory: Clear to auscultation bilaterally. Not using accessory muscles, speaking in full sentences. Abdomen: Soft, nontender, nondistended, normal bowel sounds and no palpable masses, no guarding, rigidity.  Impression and Recommendations:

## 2013-11-09 NOTE — Assessment & Plan Note (Signed)
Refilling Ambien. ?

## 2013-11-09 NOTE — Assessment & Plan Note (Signed)
Referral for colonoscopy °

## 2013-11-10 ENCOUNTER — Other Ambulatory Visit: Payer: Self-pay | Admitting: Sports Medicine

## 2013-11-10 DIAGNOSIS — A0472 Enterocolitis due to Clostridium difficile, not specified as recurrent: Secondary | ICD-10-CM

## 2013-11-10 LAB — TISSUE TRANSGLUTAMINASE, IGG: t-Transglutaminase (tTG) IgG: 6.6 U/mL (ref <20)

## 2013-11-10 LAB — GLIA (IGA/G) + TTG IGA
Deamidated Gliadin Abs, IgG: 10.7 U/mL (ref <20)
Gliadin IgA: 10.7 U/mL (ref <20)
Tissue Transglutaminase Ab, IgA: 6.8 U/mL (ref <20)

## 2013-11-10 LAB — CBC WITH DIFFERENTIAL/PLATELET
Basophils Absolute: 0 10*3/uL (ref 0.0–0.1)
Basophils Relative: 0 % (ref 0–1)
Eosinophils Absolute: 0.1 10*3/uL (ref 0.0–0.7)
Eosinophils Relative: 3 % (ref 0–5)
HCT: 43.2 % (ref 39.0–52.0)
Hemoglobin: 14.9 g/dL (ref 13.0–17.0)
Lymphocytes Relative: 27 % (ref 12–46)
Lymphs Abs: 1.3 K/uL (ref 0.7–4.0)
MCH: 35.3 pg — ABNORMAL HIGH (ref 26.0–34.0)
MCHC: 34.5 g/dL (ref 30.0–36.0)
MCV: 102.4 fL — ABNORMAL HIGH (ref 78.0–100.0)
Monocytes Absolute: 0.4 K/uL (ref 0.1–1.0)
Monocytes Relative: 8 % (ref 3–12)
Neutro Abs: 2.9 K/uL (ref 1.7–7.7)
Neutrophils Relative %: 62 % (ref 43–77)
Platelets: 242 10*3/uL (ref 150–400)
RBC: 4.22 MIL/uL (ref 4.22–5.81)
RDW: 16 % — ABNORMAL HIGH (ref 11.5–15.5)
WBC: 4.7 K/uL (ref 4.0–10.5)

## 2013-11-10 LAB — ANCA SCREEN W REFLEX TITER
Atypical p-ANCA Screen: NEGATIVE
c-ANCA Screen: NEGATIVE
p-ANCA Screen: NEGATIVE

## 2013-11-10 LAB — C. DIFFICILE GDH AND TOXIN A/B

## 2013-11-10 LAB — LIPASE: Lipase: 32 U/L (ref 0–75)

## 2013-11-10 LAB — ALLERGEN MILK: Milk IgE: <0.1 kU/L

## 2013-11-10 LAB — TSH: TSH: 1.388 u[IU]/mL (ref 0.350–4.500)

## 2013-11-10 LAB — SEDIMENTATION RATE: Sed Rate: 4 mm/h (ref 0–16)

## 2013-11-11 LAB — C. DIFFICILE GDH AND TOXIN A/B
C. difficile GDH: DETECTED — AB
C. difficile Toxin A/B: DETECTED — AB

## 2013-11-13 LAB — GIARDIA ANTIGEN: Giardia Screen (EIA): NEGATIVE

## 2013-11-14 LAB — SACCHAROMYCES CEREVISIAE ANTIBODIES, IGG AND IGA
Saccharomyces cerevisiae IgG: 11.7 U (ref <=20.0)
Saccharomyces cerevisiae, IgA: 10.5 U (ref <=20.0)

## 2013-11-15 LAB — STOOL CULTURE

## 2013-11-15 LAB — E. HISTOLYTICA ANTIBODY (AMOEBA AB)

## 2013-11-23 MED ORDER — METRONIDAZOLE 500 MG PO TABS: 500.0000 mg | ORAL_TABLET | Freq: Three times a day (TID) | ORAL | Status: DC

## 2013-11-23 NOTE — Assessment & Plan Note (Signed)
Starting Flagyl 500 mg 3 times a day for 14 days, lab result took a long time to come back

## 2013-11-23 NOTE — Addendum Note (Signed)
Addended by: Silverio Decamp on: 11/23/2013 04:52 PM   Modules accepted: Orders

## 2013-12-07 ENCOUNTER — Ambulatory Visit: Payer: BC Managed Care – PPO | Admitting: Sports Medicine

## 2013-12-12 ENCOUNTER — Encounter: Payer: Self-pay | Admitting: Sports Medicine

## 2013-12-12 ENCOUNTER — Ambulatory Visit (INDEPENDENT_AMBULATORY_CARE_PROVIDER_SITE_OTHER): Payer: BC Managed Care – PPO | Admitting: Sports Medicine

## 2013-12-12 DIAGNOSIS — A047 Enterocolitis due to Clostridium difficile: Secondary | ICD-10-CM

## 2013-12-12 DIAGNOSIS — A0472 Enterocolitis due to Clostridium difficile, not specified as recurrent: Secondary | ICD-10-CM

## 2013-12-12 MED ORDER — VANCOMYCIN HCL 125 MG PO CAPS: 125.0000 mg | ORAL_CAPSULE | Freq: Four times a day (QID) | ORAL | Status: DC

## 2013-12-12 MED ORDER — HYOSCYAMINE SULFATE 0.125 MG PO TABS: 0.1250 mg | ORAL_TABLET | ORAL | Status: DC | PRN

## 2013-12-12 MED ORDER — SACCHAROMYCES BOULARDII 250 MG PO PACK: PACK | ORAL | Status: DC

## 2013-12-12 NOTE — Assessment & Plan Note (Signed)
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 Failed oral Flagyl. Adding probiotics in an attempt to repopulate the gut. Switching to oral vancomycin along with Levsin. Return in 2 weeks. Next line

## 2013-12-12 NOTE — Progress Notes (Signed)
  Subjective:    CC: follow-up  HPI: Diarrhea: After a large panel of stool studies, he did have a positive C. Difficile toxin, we started Flagyl for 14 days, he did not improve. He does get a mild abdominal pain with occasional urges to stool, no constitutional symptoms, no rashes.  Past medical history, Surgical history, Family history not pertinant except as noted below, Social history, Allergies, and medications have been entered into the medical record, reviewed, and no changes needed.   Review of Systems: No fevers, chills, night sweats, weight loss, chest pain, or shortness of breath.   Objective:    General: Well Developed, well nourished, and in no acute distress.  Neuro: Alert and oriented x3, extra-ocular muscles intact, sensation grossly intact.  HEENT: Normocephalic, atraumatic, pupils equal round reactive to light, neck supple, no masses, no lymphadenopathy, thyroid nonpalpable.  Skin: Warm and dry, no rashes. Cardiac: Regular rate and rhythm, no murmurs rubs or gallops, no lower extremity edema.  Respiratory: Clear to auscultation bilaterally. Not using accessory muscles, speaking in full sentences. Abdomen: Soft, nontender, nondistended, normal bowel sounds, no palpable masses. No guarding or rigidity.  Impression and Recommendations:

## 2013-12-20 ENCOUNTER — Other Ambulatory Visit: Payer: Self-pay | Admitting: Sports Medicine

## 2013-12-26 ENCOUNTER — Ambulatory Visit (INDEPENDENT_AMBULATORY_CARE_PROVIDER_SITE_OTHER): Payer: BC Managed Care – PPO | Admitting: Sports Medicine

## 2013-12-26 ENCOUNTER — Encounter: Payer: Self-pay | Admitting: Sports Medicine

## 2013-12-26 VITALS — BP 109/66 | HR 78 | Ht 73.0 in | Wt 186.0 lb

## 2013-12-26 DIAGNOSIS — A047 Enterocolitis due to Clostridium difficile: Secondary | ICD-10-CM | POA: Diagnosis not present

## 2013-12-26 DIAGNOSIS — A0472 Enterocolitis due to Clostridium difficile, not specified as recurrent: Secondary | ICD-10-CM

## 2013-12-26 MED ORDER — HYOSCYAMINE SULFATE 0.125 MG PO TABS: 0.1250 mg | ORAL_TABLET | ORAL | Status: DC | PRN

## 2013-12-26 MED ORDER — VANCOMYCIN HCL 125 MG PO CAPS: 125.0000 mg | ORAL_CAPSULE | Freq: Four times a day (QID) | ORAL | Status: DC

## 2013-12-26 NOTE — Progress Notes (Signed)
  Subjective:    CC: recheck  HPI: Clostridium difficile enteritis: Christian Lambert returns, he had chronic diarrhea which we subsequently diagnosed as Clostridium difficile. He failed an initial 14 day course of metronidazole so we started oral vancomycin. He is approximately 80% better however still has occasional episodes of loose stools. No constitutional symptoms and no abdominal pain.Symptoms are mild, improving.  Past medical history, Surgical history, Family history not pertinant except as noted below, Social history, Allergies, and medications have been entered into the medical record, reviewed, and no changes needed.   Review of Systems: No fevers, chills, night sweats, weight loss, chest pain, or shortness of breath.   Objective:    General: Well Developed, well nourished, and in no acute distress.  Neuro: Alert and oriented x3, extra-ocular muscles intact, sensation grossly intact.  HEENT: Normocephalic, atraumatic, pupils equal round reactive to light, neck supple, no masses, no lymphadenopathy, thyroid nonpalpable.  Skin: Warm and dry, no rashes. Cardiac: Regular rate and rhythm, no murmurs rubs or gallops, no lower extremity edema.  Respiratory: Clear to auscultation bilaterally. Not using accessory muscles, speaking in full sentences. Abdomen: Soft, nontender, not distended, normal bowel sounds, no palpable masses.  Impression and Recommendations:

## 2013-12-26 NOTE — Patient Instructions (Signed)
Clostridium Difficile Infection °Clostridium difficile (C. difficile) is a bacteria found in the intestinal tract or colon. Under certain conditions, it causes diarrhea and sometimes severe disease. The severe form of the disease is known as pseudomembranous colitis (often called C. difficile colitis). This disease can damage the lining of the colon or cause the colon to become enlarged (toxic megacolon). °CAUSES °Your colon normally contains many different bacteria, including C. difficile. The balance of bacteria in your colon can change during illness. This is especially true when you take antibiotic medicine. Taking antibiotics may allow the C. difficile to grow, multiply excessively, and make a toxin that then causes illness. The elderly and people with certain medical conditions have a greater risk of getting C. difficile infections. °SYMPTOMS °· Watery diarrhea. °· Fever. °· Fatigue. °· Loss of appetite. °· Nausea. °· Abdominal swelling, pain, or tenderness. °· Dehydration. °DIAGNOSIS °Your symptoms may make your caregiver suspect a C. difficile infection, especially if you have used antibiotics in the preceding weeks. However, there are only 2 ways to know for certain whether you have a C. difficile infection: °· A lab test that finds the toxin in your stool. °· The specific appearance of an abnormality (pseudomembrane) in your colon. This can only be seen by doing a sigmoidoscopy or colonoscopy. These procedures involve passing an instrument through your rectum to look at the inside of your colon. °Your caregiver will help determine if these tests are necessary. °TREATMENT °· Most people are successfully treated with one of two specific antibiotics, usually given by mouth. Other antibiotics you are receiving are stopped if possible. °· Intravenous (IV) fluids and correction of electrolyte imbalance may be necessary. °· Rarely, surgery may be needed to remove the infected part of the intestines. °· Careful  hand washing by you and your caregivers is important to prevent the spread of infection. In the hospital, your caregivers may also put on gowns and gloves to prevent the spread of the C. difficile bacteria. Your room is also cleaned regularly with a solution containing bleach or a product that is known to kill C. difficile. °HOME CARE INSTRUCTIONS °· Drink enough fluids to keep your urine clear or pale yellow. Avoid milk, caffeine, and alcohol. °· Ask your caregiver for specific rehydration instructions. °· Try eating small, frequent meals rather than large meals. °· Take your antibiotics as directed. Finish them even if you start to feel better. °· Do not use medicines to slow diarrhea. This could delay healing or cause complications. °· Wash your hands thoroughly after using the bathroom and before preparing food. °· Make sure people who live with you wash their hands often, too. °· Carefully disinfect all surfaces with a product that contains chlorine bleach. °SEEK MEDICAL CARE IF: °· Diarrhea persists longer than expected or recurs after completing your course of antibiotic treatment for the C. difficile infection. °· You have trouble staying hydrated. °SEEK IMMEDIATE MEDICAL CARE IF: °· You develop a new fever. °· You have increasing abdominal pain or tenderness. °· There is blood in your stools, or your stools are dark black and tarry. °· You cannot hold down food or liquids. °MAKE SURE YOU: °· Understand these instructions. °· Will watch your condition. °· Will get help right away if you are not doing well or get worse. °Document Released: 10/29/2004 Document Revised: 06/05/2013 Document Reviewed: 06/27/2010 °ExitCare® Patient Information ©2015 [employer]. This information is not intended to replace advice given to you by your health care provider. Make sure you   discuss any questions you have with your health care provider. ° °

## 2013-12-26 NOTE — Assessment & Plan Note (Signed)
Christian Lambert returns, he is significantly better after treatment of his Clostridium difficile enteritis with vancomycin oral. He did fail metronidazole. He does continue to have a small amount of symptoms, we are going to switch to rifaximin. I'm also going to add hyoscyamine and refer him to gastroenterology. We are going to do another course of oral vancomycin, he will continue probiotics.

## 2014-01-23 ENCOUNTER — Ambulatory Visit: Payer: BC Managed Care – PPO | Admitting: Sports Medicine

## 2014-02-18 ENCOUNTER — Other Ambulatory Visit: Payer: Self-pay | Admitting: Sports Medicine

## 2014-03-08 ENCOUNTER — Other Ambulatory Visit: Payer: Self-pay | Admitting: Sports Medicine

## 2014-04-16 ENCOUNTER — Encounter: Payer: Self-pay | Admitting: Sports Medicine

## 2014-04-16 ENCOUNTER — Ambulatory Visit (INDEPENDENT_AMBULATORY_CARE_PROVIDER_SITE_OTHER): Payer: BLUE CROSS/BLUE SHIELD

## 2014-04-16 ENCOUNTER — Ambulatory Visit (INDEPENDENT_AMBULATORY_CARE_PROVIDER_SITE_OTHER): Payer: BLUE CROSS/BLUE SHIELD | Admitting: Sports Medicine

## 2014-04-16 VITALS — BP 117/71 | HR 71 | Ht 73.0 in | Wt 187.0 lb

## 2014-04-16 DIAGNOSIS — M5136 Other intervertebral disc degeneration, lumbar region: Secondary | ICD-10-CM | POA: Diagnosis not present

## 2014-04-16 DIAGNOSIS — M5125 Other intervertebral disc displacement, thoracolumbar region: Secondary | ICD-10-CM

## 2014-04-16 DIAGNOSIS — M47896 Other spondylosis, lumbar region: Secondary | ICD-10-CM

## 2014-04-16 DIAGNOSIS — M47897 Other spondylosis, lumbosacral region: Secondary | ICD-10-CM

## 2014-04-16 DIAGNOSIS — M5126 Other intervertebral disc displacement, lumbar region: Secondary | ICD-10-CM

## 2014-04-16 DIAGNOSIS — L219 Seborrheic dermatitis, unspecified: Secondary | ICD-10-CM

## 2014-04-16 DIAGNOSIS — R21 Rash and other nonspecific skin eruption: Secondary | ICD-10-CM | POA: Diagnosis not present

## 2014-04-16 DIAGNOSIS — M51369 Other intervertebral disc degeneration, lumbar region without mention of lumbar back pain or lower extremity pain: Secondary | ICD-10-CM | POA: Insufficient documentation

## 2014-04-16 DIAGNOSIS — M4317 Spondylolisthesis, lumbosacral region: Secondary | ICD-10-CM

## 2014-04-16 DIAGNOSIS — M5137 Other intervertebral disc degeneration, lumbosacral region: Secondary | ICD-10-CM

## 2014-04-16 MED ORDER — PREDNISONE 50 MG PO TABS: ORAL_TABLET | ORAL | Status: DC

## 2014-04-16 MED ORDER — CLOTRIMAZOLE-BETAMETHASONE 1-0.05 % EX CREA: 1.0000 "application " | TOPICAL_CREAM | Freq: Two times a day (BID) | CUTANEOUS | Status: DC

## 2014-04-16 MED ORDER — MELOXICAM 15 MG PO TABS: ORAL_TABLET | ORAL | Status: DC

## 2014-04-16 NOTE — Assessment & Plan Note (Signed)
X-rays, prednisone, Mobic, formal PT. Return in a month, MRI for intervention if no better. 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

## 2014-04-16 NOTE — Progress Notes (Signed)
  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 Denies any fevers, chills, or bowel or bladder dysfunction.  Skin rash: Occurred on his face after using an old bottle of lotion, there is some pruritus, swelling, and significant burning. He also has some rash on both legs, circular, pruritic.  Past medical history, Surgical history, Family history not pertinant except as noted below, Social history, Allergies, and medications have been entered into the medical record, reviewed, and no changes needed.   Review of Systems: No fevers, chills, night sweats, weight loss, chest pain, or shortness of breath.   Objective:    General: Well Developed, well nourished, and in no acute distress.  Neuro: Alert and oriented x3, extra-ocular muscles intact, sensation grossly intact.  HEENT: Normocephalic, atraumatic, pupils equal round reactive to light, neck supple, no masses, no lymphadenopathy, thyroid nonpalpable.  Skin: Warm and dry, there is erythema and scaling over the anterior face, nasolabial folds, and eyebrows, he also has several circular, scaly lesions on the legs that appear to be tinea corporis. Cardiac: Regular rate and rhythm, no murmurs rubs or gallops, no lower extremity edema.  Respiratory: Clear to auscultation bilaterally. Not using accessory muscles, speaking in full sentences.  Lumbar spine x-ray show multilevel lumbar degenerative disc disease, T12 anterior compression fracture, and L5-S1 degenerative changes with multilevel facet arthritis.  Impression and Recommendations:

## 2014-04-16 NOTE — Assessment & Plan Note (Signed)
This is acutely worsened with what appears to be a contaminated lotion from his hotel room. He has fairly severe contact dermatitis over his entire face with burning and scaling. He also has what appears to be a tinea corporis over his legs. As we don't know whether there is a fungal contamination of his lotion I am going to use topical Lotrisone and we will send in his lotion for fungal culture and KOH prep.

## 2014-04-23 ENCOUNTER — Ambulatory Visit (INDEPENDENT_AMBULATORY_CARE_PROVIDER_SITE_OTHER): Payer: BLUE CROSS/BLUE SHIELD | Admitting: Physical Therapy

## 2014-04-23 ENCOUNTER — Encounter: Payer: Self-pay | Admitting: Physical Therapy

## 2014-04-23 DIAGNOSIS — M6281 Muscle weakness (generalized): Secondary | ICD-10-CM

## 2014-04-23 DIAGNOSIS — M25651 Stiffness of right hip, not elsewhere classified: Secondary | ICD-10-CM
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
## 2014-04-23 NOTE — Patient Instructions (Signed)
Knee-to-Chest Stretch: Unilateral   With hand behind right knee, pull knee in to chest until a comfortable stretch is felt in lower back and buttocks. Keep back relaxed. Hold __20-30__ seconds. Repeat _1___ times per set. Do ___1_ sets per session. Do _1_ sessions per day.  http://orth.exer.us/126   Copyright  VHI. All rights reserved.  Lumbar Rotation (Non-Weight Bearing)   Feet on floor, slowly rock knees from side to side in small, pain-free range of motion. Allow lower back to rotate slightly. Repeat _10__ times per set. Do _1___ sets per session. Do ____ sessions per day.  http://orth.exer.us/160   Copyright  VHI. All rights reserved.  Bridging   Slowly raise buttocks from floor, keeping stomach tight. Repeat _10___ times per set. Do _2-3___ sets per session. Do _1_ sessions per day.  http://orth.exer.us/1096   Copyright  VHI. All rights reserved.  Posture - Sitting   Sit upright, head facing forward. Try using a roll to support lower back. Keep shoulders relaxed, and avoid rounded back. Keep hips level with knees. Avoid crossing legs for long periods.   Copyright  VHI. All rights reserved.  Alternating Positions   Alternate tasks and change positions frequently to reduce fatigue and muscle tension. Take rest breaks. When driving take frequent breaks.    Copyright  VHI. All rights reserved.  Keeping Chin Tucked   Keep chin tucked and shoulders back when picking up objects. WHEN driving, keep head back, every so often press head into head rest. Also keep shoulders back and chest lifted   Copyright  VHI. All rights reserved.

## 2014-05-14 LAB — FUNGUS CULTURE W SMEAR: Smear Result: NONE SEEN

## 2014-06-23 ENCOUNTER — Other Ambulatory Visit: Payer: Self-pay | Admitting: Sports Medicine

## 2014-08-04 ENCOUNTER — Other Ambulatory Visit: Payer: Self-pay | Admitting: Sports Medicine

## 2014-08-07 ENCOUNTER — Other Ambulatory Visit: Payer: Self-pay | Admitting: Sports Medicine

## 2014-08-09 ENCOUNTER — Other Ambulatory Visit: Payer: Self-pay | Admitting: Sports Medicine

## 2014-08-10 ENCOUNTER — Other Ambulatory Visit: Payer: Self-pay | Admitting: Sports Medicine

## 2014-11-08 ENCOUNTER — Other Ambulatory Visit: Payer: Self-pay | Admitting: Sports Medicine

## 2014-11-22 ENCOUNTER — Other Ambulatory Visit: Payer: Self-pay | Admitting: Sports Medicine

## 2014-12-11 ENCOUNTER — Ambulatory Visit (INDEPENDENT_AMBULATORY_CARE_PROVIDER_SITE_OTHER): Payer: BLUE CROSS/BLUE SHIELD | Admitting: Sports Medicine

## 2014-12-11 VITALS — BP 129/83 | HR 76 | Temp 98.3°F | Resp 18 | Wt 188.3 lb

## 2014-12-11 DIAGNOSIS — E785 Hyperlipidemia, unspecified: Secondary | ICD-10-CM | POA: Diagnosis not present

## 2014-12-11 DIAGNOSIS — G4700 Insomnia, unspecified: Secondary | ICD-10-CM

## 2014-12-11 DIAGNOSIS — Z299 Encounter for prophylactic measures, unspecified: Secondary | ICD-10-CM

## 2014-12-11 DIAGNOSIS — Z9079 Acquired absence of other genital organ(s): Secondary | ICD-10-CM

## 2014-12-11 DIAGNOSIS — Z23 Encounter for immunization: Secondary | ICD-10-CM | POA: Diagnosis not present

## 2014-12-11 DIAGNOSIS — R221 Localized swelling, mass and lump, neck: Secondary | ICD-10-CM | POA: Diagnosis not present

## 2014-12-11 MED ORDER — ZOLPIDEM TARTRATE 10 MG PO TABS: 10.0000 mg | ORAL_TABLET | Freq: Every day | ORAL | Status: DC

## 2014-12-11 NOTE — Assessment & Plan Note (Signed)
Checking PSA 

## 2014-12-11 NOTE — Progress Notes (Signed)
  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 No changes in voice, no constitutional symptoms, no dysphagia  Past medical history, Surgical history, Family history not pertinant except as noted below, Social history, Allergies, and medications have been entered into the medical record, reviewed, and no changes needed.   Review of Systems: No fevers, chills, night sweats, weight loss, chest pain, or shortness of breath.   Objective:    General: Well Developed, well nourished, and in no acute distress.  Neuro: Alert and oriented x3, extra-ocular muscles intact, sensation grossly intact.  HEENT: Normocephalic, atraumatic, pupils equal round reactive to light, neck supple, no lymphadenopathy, thyroid nonpalpable, there is a very difficult to discern mass at the posterior right margin of the mid larynx.  Skin: Warm and dry, no rashes. Cardiac: Regular rate and rhythm, no murmurs rubs or gallops, no lower extremity edema.  Respiratory: Clear to auscultation bilaterally. Not using accessory muscles, speaking in full sentences.  Impression and Recommendations:

## 2014-12-11 NOTE — Assessment & Plan Note (Signed)
Right-sided and palpably behind the larynx in this former smoker. We are going to obtain a CT with IV contrast of the neck and soft tissues.

## 2014-12-11 NOTE — Assessment & Plan Note (Signed)
Refilling Ambien. ?

## 2014-12-11 NOTE — Assessment & Plan Note (Signed)
Patient is fasting, rechecking lipids.

## 2014-12-12 LAB — COMPREHENSIVE METABOLIC PANEL WITH GFR
ALT: 14 U/L (ref 9–46)
Albumin: 4.3 g/dL (ref 3.6–5.1)
Alkaline Phosphatase: 87 U/L (ref 40–115)
CO2: 26 mmol/L (ref 20–31)
Calcium: 9.3 mg/dL (ref 8.6–10.3)
Creat: 0.88 mg/dL (ref 0.70–1.18)
Sodium: 137 mmol/L (ref 135–146)
Total Bilirubin: 0.9 mg/dL (ref 0.2–1.2)

## 2014-12-12 LAB — PSA, TOTAL AND FREE
PSA, Free: <0.01 ng/mL
PSA: <0.01 ng/mL (ref <=4.00)

## 2014-12-12 LAB — LIPID PANEL
Cholesterol: 152 mg/dL (ref 125–200)
HDL: 53 mg/dL (ref >=40)
LDL Cholesterol: 77 mg/dL (ref <130)
Total CHOL/HDL Ratio: 2.9 Ratio (ref <=5.0)
Triglycerides: 111 mg/dL (ref <150)
VLDL: 22 mg/dL (ref <30)

## 2014-12-12 LAB — CBC
HCT: 43.7 % (ref 39.0–52.0)
Hemoglobin: 15.2 g/dL (ref 13.0–17.0)
MCH: 35.5 pg — ABNORMAL HIGH (ref 26.0–34.0)
MCHC: 34.8 g/dL (ref 30.0–36.0)
MCV: 102.1 fL — ABNORMAL HIGH (ref 78.0–100.0)
MPV: 9.2 fL (ref 8.6–12.4)
Platelets: 253 K/uL (ref 150–400)
RBC: 4.28 MIL/uL (ref 4.22–5.81)
RDW: 15.9 % — ABNORMAL HIGH (ref 11.5–15.5)
WBC: 4.9 10*3/uL (ref 4.0–10.5)

## 2014-12-12 LAB — HEPATITIS C ANTIBODY: HCV Ab: NEGATIVE

## 2014-12-12 LAB — COMPREHENSIVE METABOLIC PANEL
AST: 22 U/L (ref 10–35)
BUN: 13 mg/dL (ref 7–25)
Chloride: 100 mmol/L (ref 98–110)
Glucose, Bld: 88 mg/dL (ref 65–99)
Potassium: 4.8 mmol/L (ref 3.5–5.3)
Total Protein: 6.8 g/dL (ref 6.1–8.1)

## 2014-12-12 NOTE — Addendum Note (Signed)
Addended by: Elizabeth Sauer on: 12/12/2014 08:16 AM   Modules accepted: Orders

## 2014-12-17 ENCOUNTER — Ambulatory Visit (INDEPENDENT_AMBULATORY_CARE_PROVIDER_SITE_OTHER): Payer: BLUE CROSS/BLUE SHIELD

## 2014-12-17 DIAGNOSIS — R221 Localized swelling, mass and lump, neck: Secondary | ICD-10-CM | POA: Diagnosis not present

## 2015-01-11 ENCOUNTER — Telehealth: Payer: Self-pay

## 2015-01-11 NOTE — Telephone Encounter (Signed)
Left message for patient to call office regarding CT results.  

## 2015-01-11 NOTE — Telephone Encounter (Signed)
-----   Message from Silverio Decamp, MD sent at 12/17/2014  4:44 PM EST ----- CT is negative, mass is a benign asymmetry of the thyroid, no further intervention needed.

## 2015-02-18 ENCOUNTER — Other Ambulatory Visit: Payer: Self-pay | Admitting: Sports Medicine

## 2015-03-11 ENCOUNTER — Ambulatory Visit (INDEPENDENT_AMBULATORY_CARE_PROVIDER_SITE_OTHER): Payer: BLUE CROSS/BLUE SHIELD | Admitting: Sports Medicine

## 2015-03-11 ENCOUNTER — Encounter: Payer: Self-pay | Admitting: Sports Medicine

## 2015-03-11 DIAGNOSIS — A0472 Enterocolitis due to Clostridium difficile, not specified as recurrent: Secondary | ICD-10-CM

## 2015-03-11 DIAGNOSIS — A047 Enterocolitis due to Clostridium difficile: Secondary | ICD-10-CM

## 2015-03-11 MED ORDER — ZOLPIDEM TARTRATE 10 MG PO TABS: 10.0000 mg | ORAL_TABLET | Freq: Every day | ORAL | Status: DC

## 2015-03-11 MED ORDER — DIPHENOXYLATE-ATROPINE 2.5-0.025 MG PO TABS: ORAL_TABLET | ORAL | Status: DC

## 2015-03-11 NOTE — Progress Notes (Signed)
  Subjective:    CC: Ear pain and watery diarrhea  HPI: Patient presents today to request a refill of his zolpidem medication. He also notes that he had one episode of ear pain 3 days ago. The patient was on a plane and felt right ear pain on descent. He said the pain came on acutely and did not last longer than 1 hours. He says that he has chronic ear ringing but has noted no difference. He denies fevers, hearing loss, or an audible "pop" at the time of the pain. He denies vertigo or visual changes.  Patient also says that he has been having watery diarrhea for the past 2 days. He had 3 watery bowel movements yesterday and reports 1 this morning. He denies N/V, abdominal pain, and hematochezia. He has not been around any sick contacts or eaten anything different. He has not been hiking or out of the country in the past few months. He says he has been on the road and eating "junk" but clarifies that he means fast food and he denies eating at any "mom and pop" type places.  Past medical history, Surgical history, Family history not pertinant except as noted below, Social history, Allergies, and medications have been entered into the medical record, reviewed, and no changes needed.   Review of Systems: No fevers, chills, night sweats, weight loss, chest pain, or shortness of breath.   Objective:    General: Well Developed, well nourished, and in no acute distress.  Neuro: Alert and oriented x3, extra-ocular muscles intact, sensation grossly intact.  HEENT: Normocephalic, atraumatic, pupils equal round reactive to light, neck supple, no masses, no lymphadenopathy, thyroid nonpalpable, TM clear and without bulge bilaterally with no signs of perforation or deformity.  Skin: Warm and dry, no rashes. Cardiac: Regular rate and rhythm, no murmurs rubs or gallops, no lower extremity edema.  Respiratory: Clear to auscultation bilaterally. Not using accessory muscles, speaking in full sentences. Abdomen:  NT/ND. Normoactive bowel sounds. No hepatosplenomegaly.  Impression and Recommendations:    Refill: will refill patient's zolpidem Rx for 3 months  Ear pain: Presentation consistent with pain secondary to pressure changes. Patient instructed to attempt some techniques during altitude changes.  Diarrhea: Presentation is most likely benign and related to viral enteritis. However, given patient's PMHx of C. diff, stool toxin and cultures will be checked. He will be given Lomotil as he has had a past allergic reaction to Imodium.

## 2015-03-11 NOTE — Assessment & Plan Note (Addendum)
Adding Lomotil, tells me he has a allergy to Imodium but does okay with narcotics. History of C. Difficile colitis, so we will check stool cultures, and a C. Difficile toxin.

## 2015-03-13 LAB — C. DIFFICILE GDH AND TOXIN A/B
C. difficile GDH: NOT DETECTED
C. difficile Toxin A/B: NOT DETECTED

## 2015-03-16 LAB — STOOL CULTURE

## 2015-03-25 ENCOUNTER — Ambulatory Visit: Payer: BLUE CROSS/BLUE SHIELD | Admitting: Sports Medicine

## 2015-05-24 ENCOUNTER — Other Ambulatory Visit: Payer: Self-pay | Admitting: Sports Medicine

## 2015-06-06 ENCOUNTER — Other Ambulatory Visit: Payer: Self-pay | Admitting: Sports Medicine

## 2015-06-10 ENCOUNTER — Ambulatory Visit (INDEPENDENT_AMBULATORY_CARE_PROVIDER_SITE_OTHER): Payer: BLUE CROSS/BLUE SHIELD | Admitting: Sports Medicine

## 2015-06-10 ENCOUNTER — Encounter: Payer: Self-pay | Admitting: Sports Medicine

## 2015-06-10 VITALS — BP 124/79 | HR 87 | Resp 18 | Wt 189.0 lb

## 2015-06-10 DIAGNOSIS — L219 Seborrheic dermatitis, unspecified: Secondary | ICD-10-CM

## 2015-06-10 DIAGNOSIS — M7021 Olecranon bursitis, right elbow: Secondary | ICD-10-CM | POA: Insufficient documentation

## 2015-06-10 MED ORDER — TRIAMCINOLONE ACETONIDE 0.5 % EX OINT: 1.0000 "application " | TOPICAL_OINTMENT | Freq: Two times a day (BID) | CUTANEOUS | Status: DC

## 2015-06-10 MED ORDER — ZOLPIDEM TARTRATE 10 MG PO TABS: 10.0000 mg | ORAL_TABLET | Freq: Every day | ORAL | Status: DC

## 2015-06-10 NOTE — Assessment & Plan Note (Signed)
Switching to triamcinolone.

## 2015-06-10 NOTE — Progress Notes (Signed)
  Subjective:    CC: right elbow swelling  HPI: This is a pleasant 75 year old male, he bumped his elbow on something years ago, since then he's had swelling that he localizes over his right olecranon. No warmth, no constitutional symptoms, minimal pain.  Skin rash: Localized over the nasolabial folds and eyebrows.  Past medical history, Surgical history, Family history not pertinant except as noted below, Social history, Allergies, and medications have been entered into the medical record, reviewed, and no changes needed.   Review of Systems: No fevers, chills, night sweats, weight loss, chest pain, or shortness of breath.   Objective:    General: Well Developed, well nourished, and in no acute distress.  Neuro: Alert and oriented x3, extra-ocular muscles intact, sensation grossly intact.  HEENT: Normocephalic, atraumatic, pupils equal round reactive to light, neck supple, no masses, no lymphadenopathy, thyroid nonpalpable.  Skin: Warm and dry, no rashes. Cardiac: Regular rate and rhythm, no murmurs rubs or gallops, no lower extremity edema.  Respiratory: Clear to auscultation bilaterally. Not using accessory muscles, speaking in full sentences. Right Elbow: Unremarkable to inspection. Range of motion full pronation, supination, flexion, extension. Strength is full to all of the above directions Stable to varus, valgus stress. Negative moving valgus stress test. No discrete areas of tenderness to palpation. Ulnar nerve does not sublux. Negative cubital tunnel Tinel's.  Procedure: Real-time Ultrasound Guided aspiration/Injection of right olecranon bursa Device: GE Logiq E  Verbal informed consent obtained.  Time-out conducted.  Noted no overlying erythema, induration, or other signs of local infection.  Skin prepped in a sterile fashion.  Local anesthesia: Topical Ethyl chloride.  With sterile technique and under real time ultrasound guidance:  Drained approximately 15 mL of  frank blood, syringe switched and 1 mL kenalog 40, 1 mL lidocaine injected easily. Completed without difficulty  Pain immediately resolved suggesting accurate placement of the medication.  Advised to call if fevers/chills, erythema, induration, drainage, or persistent bleeding.  Images permanently stored and available for review in the ultrasound unit.  Impression: Technically successful ultrasound guided injection.  Elbow was strapped with compressive dressing.  Impression and Recommendations:

## 2015-06-10 NOTE — Assessment & Plan Note (Addendum)
Aspiration and injection as above,fluid analysis and cultures obtained. Strapped with compressive dressing.

## 2015-06-11 LAB — SYNOVIAL CELL COUNT + DIFF, W/ CRYSTALS
Basophils, %: 0 %
Eosinophils-Synovial: 1 % (ref 0–2)
Lymphocytes-Synovial Fld: 89 % — ABNORMAL HIGH (ref 0–74)
Monocyte/Macrophage: 9 % (ref 0–69)
Neutrophil, Synovial: 10 % (ref 0–24)
Synoviocytes, %: 0 % (ref 0–15)
WBC, Synovial: 815 cells/uL — ABNORMAL HIGH (ref <150)

## 2015-06-14 LAB — BODY FLUID CULTURE
Gram Stain: NONE SEEN
Organism ID, Bacteria: NO GROWTH

## 2015-09-03 ENCOUNTER — Other Ambulatory Visit: Payer: Self-pay | Admitting: Sports Medicine

## 2015-09-09 ENCOUNTER — Other Ambulatory Visit: Payer: Self-pay | Admitting: Sports Medicine

## 2015-09-18 ENCOUNTER — Other Ambulatory Visit: Payer: Self-pay | Admitting: Sports Medicine

## 2015-12-02 ENCOUNTER — Other Ambulatory Visit: Payer: Self-pay | Admitting: Sports Medicine

## 2015-12-05 ENCOUNTER — Ambulatory Visit (INDEPENDENT_AMBULATORY_CARE_PROVIDER_SITE_OTHER): Payer: BLUE CROSS/BLUE SHIELD | Admitting: Sports Medicine

## 2015-12-05 DIAGNOSIS — Z23 Encounter for immunization: Secondary | ICD-10-CM | POA: Diagnosis not present

## 2015-12-18 ENCOUNTER — Other Ambulatory Visit: Payer: Self-pay | Admitting: Sports Medicine

## 2016-03-10 ENCOUNTER — Ambulatory Visit (INDEPENDENT_AMBULATORY_CARE_PROVIDER_SITE_OTHER): Payer: BLUE CROSS/BLUE SHIELD | Admitting: Sports Medicine

## 2016-03-10 DIAGNOSIS — R1310 Dysphagia, unspecified: Secondary | ICD-10-CM

## 2016-03-10 DIAGNOSIS — R220 Localized swelling, mass and lump, head: Secondary | ICD-10-CM | POA: Diagnosis not present

## 2016-03-10 DIAGNOSIS — K225 Diverticulum of esophagus, acquired: Secondary | ICD-10-CM | POA: Insufficient documentation

## 2016-03-10 MED ORDER — ZOLPIDEM TARTRATE 10 MG PO TABS: 10.0000 mg | ORAL_TABLET | Freq: Every day | ORAL | 0 refills | Status: DC

## 2016-03-10 MED ORDER — PREDNISONE 50 MG PO TABS: ORAL_TABLET | ORAL | 0 refills | Status: DC

## 2016-03-10 MED ORDER — GEMFIBROZIL 600 MG PO TABS: ORAL_TABLET | ORAL | 3 refills | Status: DC

## 2016-03-10 NOTE — Assessment & Plan Note (Signed)
Unclear etiology. He does have a recent negative CT of the neck and soft tissues with IV contrast. Referral to ENT.

## 2016-03-10 NOTE — Progress Notes (Signed)
  Subjective:    CC: Bumps on tongue  HPI: This is a pleasant 76 year old male, past several weeks he's noted tender bumps on his tongue, no trauma. No constitutional symptoms. For sometime now he's also noted mild dysphagia, simple difficulty with swallowing, no choking. No difficulty speaking. No shortness of breath. We did incidentally note a possible mass on a thyroid ultrasound, subsequent CT of the neck with IV contrast showed no abnormalities.  Past medical history:  Negative.  See flowsheet/record as well for more information.  Surgical history: Negative.  See flowsheet/record as well for more information.  Family history: Negative.  See flowsheet/record as well for more information.  Social history: Negative.  See flowsheet/record as well for more information.  Allergies, and medications have been entered into the medical record, reviewed, and no changes needed.   Review of Systems: No fevers, chills, night sweats, weight loss, chest pain, or shortness of breath.   Objective:    General: Well Developed, well nourished, and in no acute distress.  Neuro: Alert and oriented x3, extra-ocular muscles intact, sensation grossly intact.  HEENT: Normocephalic, atraumatic, pupils equal round reactive to light, neck supple, no masses, no lymphadenopathy, thyroid nonpalpable. Oropharynx, nasopharynx, ear canals unremarkable, there is a tender nodule over the anterior tongue with some effacement of the papillae. No erythema, induration. Skin: Warm and dry, no rashes. Cardiac: Regular rate and rhythm, no murmurs rubs or gallops, no lower extremity edema.  Respiratory: Clear to auscultation bilaterally. Not using accessory muscles, speaking in full sentences.  Impression and Recommendations:    Dysphagia Unclear etiology. He does have a recent negative CT of the neck and soft tissues with IV contrast. Referral to ENT.  Nodule of tongue Unclear etiology, slightly tender, question  mucocele. Few days of prednisone, he can bring this up with his ENT as well.  I spent 25 minutes with this patient, greater than 50% was face-to-face time counseling regarding the above diagnoses

## 2016-03-10 NOTE — Assessment & Plan Note (Signed)
Unclear etiology, slightly tender, question mucocele. Few days of prednisone, he can bring this up with his ENT as well.

## 2016-06-01 ENCOUNTER — Other Ambulatory Visit: Payer: Self-pay

## 2016-06-01 MED ORDER — ZOLPIDEM TARTRATE 10 MG PO TABS: 10.0000 mg | ORAL_TABLET | Freq: Every day | ORAL | 0 refills | Status: DC

## 2016-08-26 ENCOUNTER — Other Ambulatory Visit: Payer: Self-pay | Admitting: Sports Medicine

## 2016-08-26 NOTE — Telephone Encounter (Signed)
Pt left VM requesting refill on Ambien. Pt has not been seen for a diagnosis associated with this Rx recently. Did leave VM for Pt advising he needs to call for an appointment. Routing Rx refill to PCP for review.

## 2016-08-27 MED ORDER — ZOLPIDEM TARTRATE 10 MG PO TABS: 10.0000 mg | ORAL_TABLET | Freq: Every day | ORAL | 0 refills | Status: DC

## 2016-09-01 ENCOUNTER — Ambulatory Visit (INDEPENDENT_AMBULATORY_CARE_PROVIDER_SITE_OTHER): Payer: BLUE CROSS/BLUE SHIELD | Admitting: Sports Medicine

## 2016-09-01 DIAGNOSIS — A0472 Enterocolitis due to Clostridium difficile, not specified as recurrent: Secondary | ICD-10-CM | POA: Diagnosis not present

## 2016-09-01 DIAGNOSIS — E7800 Pure hypercholesterolemia, unspecified: Secondary | ICD-10-CM

## 2016-09-01 DIAGNOSIS — D7589 Other specified diseases of blood and blood-forming organs: Secondary | ICD-10-CM | POA: Diagnosis not present

## 2016-09-01 LAB — CBC
HCT: 41 % (ref 38.5–50.0)
Hemoglobin: 14.1 g/dL (ref 13.2–17.1)
MCH: 36 pg — ABNORMAL HIGH (ref 27.0–33.0)
MCHC: 34.4 g/dL (ref 32.0–36.0)
MCV: 104.6 fL — ABNORMAL HIGH (ref 80.0–100.0)
MPV: 9.8 fL (ref 7.5–12.5)
Platelets: 241 K/uL (ref 140–400)
RBC: 3.92 MIL/uL — ABNORMAL LOW (ref 4.20–5.80)
RDW: 16.7 % — ABNORMAL HIGH (ref 11.0–15.0)
WBC: 4.1 10*3/uL (ref 3.8–10.8)

## 2016-09-01 MED ORDER — ZOLPIDEM TARTRATE 10 MG PO TABS: 10.0000 mg | ORAL_TABLET | Freq: Every day | ORAL | 0 refills | Status: DC

## 2016-09-01 NOTE — Assessment & Plan Note (Signed)
Having a recurrence of occasional explosive diarrhea. He does tell me that this occurs typically after drinking coffee which is normal. He will doubled or tripled the fiber in his diet, considering history of Clostridium difficile enteritis we are doing Clostridium difficile toxin tests and stool cultures.

## 2016-09-01 NOTE — Progress Notes (Signed)
  Subjective:    CC: Follow-up  HPI: Bruising: Noted a few bruises on the arms, these occurred after bumping into things, denies any bleeding from the gums, petechia, bleeding in the stool.  Diarrhea: History of C. difficile enteritis, he describes this as different, he typically gets normal stools, no melena or hematochezia, and then after eating spicy foods and/or having coffee he gets a burning sensation and explosive diarrhea that lasts for one episode and then is resolved. No constitutional symptoms.  Insomnia: Needs a refill on zolpidem.  Past medical history:  Negative.  See flowsheet/record as well for more information.  Surgical history: Negative.  See flowsheet/record as well for more information.  Family history: Negative.  See flowsheet/record as well for more information.  Social history: Negative.  See flowsheet/record as well for more information.  Allergies, and medications have been entered into the medical record, reviewed, and no changes needed.   Review of Systems: No fevers, chills, night sweats, weight loss, chest pain, or shortness of breath.   Objective:    General: Well Developed, well nourished, and in no acute distress.  Neuro: Alert and oriented x3, extra-ocular muscles intact, sensation grossly intact.  HEENT: Normocephalic, atraumatic, pupils equal round reactive to light, neck supple, no masses, no lymphadenopathy, thyroid nonpalpable.  Skin: Warm and dry, no rashes. There are a few scattered senile purpuras over the arms. Cardiac: Regular rate and rhythm, no murmurs rubs or gallops, no lower extremity edema.  Respiratory: Clear to auscultation bilaterally. Not using accessory muscles, speaking in full sentences. Abdomen: Soft, nontender, nondistended, normal bowel sounds, no palpable masses, no guarding, rigidity, rebound pain.  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 He does tell me that this occurs typically after drinking coffee which is normal. He will doubled or tripled the fiber in his diet, considering history of Clostridium difficile enteritis we are doing Clostridium difficile toxin tests and stool cultures.   Hyperlipidemia Checking routine blood work  I spent 25 minutes with this patient, greater than 50% was face-to-face time counseling regarding the above diagnoses

## 2016-09-01 NOTE — Assessment & Plan Note (Signed)
Checking routine bloodwork. 

## 2016-09-02 DIAGNOSIS — D7589 Other specified diseases of blood and blood-forming organs: Secondary | ICD-10-CM | POA: Insufficient documentation

## 2016-09-02 LAB — COMPREHENSIVE METABOLIC PANEL WITH GFR
ALT: 14 U/L (ref 9–46)
Albumin: 4.1 g/dL (ref 3.6–5.1)
Alkaline Phosphatase: 77 U/L (ref 40–115)
BUN: 15 mg/dL (ref 7–25)
Potassium: 4.2 mmol/L (ref 3.5–5.3)
Total Protein: 6.3 g/dL (ref 6.1–8.1)

## 2016-09-02 LAB — COMPREHENSIVE METABOLIC PANEL
AST: 20 U/L (ref 10–35)
CO2: 20 mmol/L (ref 20–31)
Calcium: 8.7 mg/dL (ref 8.6–10.3)
Chloride: 107 mmol/L (ref 98–110)
Creat: 0.83 mg/dL (ref 0.70–1.18)
Glucose, Bld: 94 mg/dL (ref 65–99)
Sodium: 140 mmol/L (ref 135–146)
Total Bilirubin: 0.7 mg/dL (ref 0.2–1.2)

## 2016-09-02 LAB — HEMOGLOBIN A1C
Hgb A1c MFr Bld: 5 % (ref <5.7)
Mean Plasma Glucose: 97 mg/dL

## 2016-09-02 LAB — LIPID PANEL W/REFLEX DIRECT LDL
Cholesterol: 157 mg/dL (ref <200)
HDL: 53 mg/dL (ref >40)
LDL-Cholesterol: 85 mg/dL
Non-HDL Cholesterol (Calc): 104 mg/dL (ref <130)
Total CHOL/HDL Ratio: 3 Ratio (ref <5.0)
Triglycerides: 94 mg/dL (ref <150)

## 2016-09-02 LAB — ALLERGEN MILK: Milk IgE: <0.1 kU/L

## 2016-09-02 MED ORDER — VITAMIN B-12 1000 MCG PO TABS: 1000.0000 ug | ORAL_TABLET | Freq: Every day | ORAL | 11 refills | Status: DC

## 2016-09-02 NOTE — Addendum Note (Signed)
Addended by: Silverio Decamp on: 09/02/2016 12:11 PM   Modules accepted: Orders

## 2016-09-02 NOTE — Assessment & Plan Note (Signed)
Persistent episodes of abnormal stools, also with macrocytosis suggestive of low B12, he will increase vitamin B12 supplementation, 1000 g daily. If persistent symptoms I would consider a scope and biopsy, as well as possible CT, looking for again for terminal ileitis associated with Crohn's disease which can cause low B12 and macrocytosis.

## 2016-09-03 LAB — C. DIFFICILE GDH AND TOXIN A/B
C. difficile GDH: NOT DETECTED
C. difficile Toxin A/B: NOT DETECTED

## 2016-09-06 LAB — STOOL CULTURE

## 2016-11-24 ENCOUNTER — Ambulatory Visit (INDEPENDENT_AMBULATORY_CARE_PROVIDER_SITE_OTHER): Payer: BLUE CROSS/BLUE SHIELD | Admitting: Sports Medicine

## 2016-11-24 DIAGNOSIS — F321 Major depressive disorder, single episode, moderate: Secondary | ICD-10-CM

## 2016-11-24 DIAGNOSIS — L259 Unspecified contact dermatitis, unspecified cause: Secondary | ICD-10-CM

## 2016-11-24 DIAGNOSIS — F5101 Primary insomnia: Secondary | ICD-10-CM

## 2016-11-24 HISTORY — DX: Major depressive disorder, single episode, moderate: F32.1

## 2016-11-24 MED ORDER — ESCITALOPRAM OXALATE 5 MG PO TABS: 5.0000 mg | ORAL_TABLET | Freq: Every day | ORAL | 3 refills | Status: DC

## 2016-11-24 MED ORDER — ZOLPIDEM TARTRATE 10 MG PO TABS: 10.0000 mg | ORAL_TABLET | Freq: Every day | ORAL | 0 refills | Status: DC

## 2016-11-24 MED ORDER — TRIAMCINOLONE ACETONIDE 0.5 % EX CREA: 1.0000 "application " | TOPICAL_CREAM | Freq: Two times a day (BID) | CUTANEOUS | 3 refills | Status: DC

## 2016-11-24 NOTE — Assessment & Plan Note (Addendum)
Starting Lexapro 5. Return in 1 month for a PHQ and GAD.

## 2016-11-24 NOTE — Assessment & Plan Note (Signed)
Right shoulder, left shin, and behind the left ear. Adding topical triamcinolone cream, return in 1 month, if no better we will consider biopsy, particularly the lesion behind his left ear.

## 2016-11-24 NOTE — Progress Notes (Signed)
  Subjective:    CC: Multiple issues  HPI: Skin rash: Present for a few months, no changes in creams or detergents, highly pruritic, left shin and right upper shoulder.  Also has a lesion behind the left ear.  Mood disorder: Anxiety, depressed mood, no suicidal or homicidal ideation, difficulty sleeping, focusing, poor energy.  Past medical history:  Negative.  See flowsheet/record as well for more information.  Surgical history: Negative.  See flowsheet/record as well for more information.  Family history: Negative.  See flowsheet/record as well for more information.  Social history: Negative.  See flowsheet/record as well for more information.  Allergies, and medications have been entered into the medical record, reviewed, and no changes needed.   Review of Systems: No fevers, chills, night sweats, weight loss, chest pain, or shortness of breath.   Objective:    General: Well Developed, well nourished, and in no acute distress.  Neuro: Alert and oriented x3, extra-ocular muscles intact, sensation grossly intact.  HEENT: Normocephalic, atraumatic, pupils equal round reactive to light, neck supple, no masses, no lymphadenopathy, thyroid nonpalpable.  Skin: Warm and dry, papular, minimally erythematous rash on the left shin in the right upper shoulder.  There is a small papule behind the left ear. Cardiac: Regular rate and rhythm, no murmurs rubs or gallops, no lower extremity edema.  Respiratory: Clear to auscultation bilaterally. Not using accessory muscles, speaking in full sentences.  Impression and Recommendations:    Depression, major, single episode, moderate (HCC) Starting Lexapro 5. Return in 1 month for a PHQ and GAD.  Contact dermatitis Right shoulder, left shin, and behind the left ear. Adding topical triamcinolone cream, return in 1 month, if no better we will consider biopsy, particularly the lesion behind his left ear.   Insomnia Tells me that his Ambien has lost  secondary to uncontrolled depression. Refilling zolpidem, but also adding Lexapro.  ___________________________________________ Gwen Her. Dianah Field, M.D., ABFM., CAQSM. Primary Care and [place] Instructor of [place] of [facility] of Medicine

## 2016-11-24 NOTE — Assessment & Plan Note (Signed)
Tells me that his Ambien has lost secondary to uncontrolled depression. Refilling zolpidem, but also adding Lexapro.

## 2016-11-26 ENCOUNTER — Ambulatory Visit (INDEPENDENT_AMBULATORY_CARE_PROVIDER_SITE_OTHER): Payer: BLUE CROSS/BLUE SHIELD | Admitting: Sports Medicine

## 2016-11-26 DIAGNOSIS — Z23 Encounter for immunization: Secondary | ICD-10-CM

## 2016-12-22 ENCOUNTER — Ambulatory Visit (INDEPENDENT_AMBULATORY_CARE_PROVIDER_SITE_OTHER): Payer: BLUE CROSS/BLUE SHIELD | Admitting: Sports Medicine

## 2016-12-22 DIAGNOSIS — L259 Unspecified contact dermatitis, unspecified cause: Secondary | ICD-10-CM | POA: Diagnosis not present

## 2016-12-22 DIAGNOSIS — F321 Major depressive disorder, single episode, moderate: Secondary | ICD-10-CM | POA: Diagnosis not present

## 2016-12-22 MED ORDER — CLOBETASOL PROPIONATE 0.05 % EX OINT: 1.0000 "application " | TOPICAL_OINTMENT | Freq: Two times a day (BID) | CUTANEOUS | 0 refills | Status: DC

## 2016-12-22 MED ORDER — ESCITALOPRAM OXALATE 10 MG PO TABS: 10.0000 mg | ORAL_TABLET | Freq: Every day | ORAL | 3 refills | Status: DC

## 2016-12-22 NOTE — Assessment & Plan Note (Signed)
Increasing the clobetasol but overall improved considerably.

## 2016-12-22 NOTE — Progress Notes (Signed)
  Subjective:    CC: Follow-up depression  HPI: Christian Lambert returns, he has had small improvement in his depression with Lexapro 5, still has difficulty sleeping, he does note that his Ambien worked well until recently, understands that this may be related to his depression, no suicidal or homicidal ideation.  In addition he would like to discuss his rash, it is improved considerably with topical triamcinolone.  Past medical history:  Negative.  See flowsheet/record as well for more information.  Surgical history: Negative.  See flowsheet/record as well for more information.  Family history: Negative.  See flowsheet/record as well for more information.  Social history: Negative.  See flowsheet/record as well for more information.  Allergies, and medications have been entered into the medical record, reviewed, and no changes needed.   Review of Systems: No fevers, chills, night sweats, weight loss, chest pain, or shortness of breath.   Objective:    General: Well Developed, well nourished, and in no acute distress.  Neuro: Alert and oriented x3, extra-ocular muscles intact, sensation grossly intact.  HEENT: Normocephalic, atraumatic, pupils equal round reactive to light, neck supple, no masses, no lymphadenopathy, thyroid nonpalpable.  Skin: Warm and dry, rash from before his essentially resolved Cardiac: Regular rate and rhythm, no murmurs rubs or gallops, no lower extremity edema.  Respiratory: Clear to auscultation bilaterally. Not using accessory muscles, speaking in full sentences.  Impression and Recommendations:    Depression, major, single episode, moderate (HCC) Increasing Lexapro to 10 mg daily, continued insomnia is likely related to insufficiently controlled depression rather than lack of efficacy of his Ambien. I do think once his depression is controlled he will note improved efficacy of the Ambien. If insufficient relief at the next visit we will probably switch him to  [place].   Contact dermatitis Increasing the clobetasol but overall improved considerably.  I spent 25 minutes with this patient, greater than 50% was face-to-face time counseling regarding the above diagnoses ___________________________________________ Gwen Her. Dianah Field, M.D., ABFM., CAQSM. Primary Care and [place] Instructor of [place] of [facility] of Medicine

## 2016-12-22 NOTE — Assessment & Plan Note (Signed)
Increasing Lexapro to 10 mg daily, continued insomnia is likely related to insufficiently controlled depression rather than lack of efficacy of his Ambien. I do think once his depression is controlled he will note improved efficacy of the Ambien. If insufficient relief at the next visit we will probably switch him to [place].

## 2017-01-19 ENCOUNTER — Ambulatory Visit (INDEPENDENT_AMBULATORY_CARE_PROVIDER_SITE_OTHER): Payer: BLUE CROSS/BLUE SHIELD | Admitting: Sports Medicine

## 2017-01-19 ENCOUNTER — Encounter: Payer: Self-pay | Admitting: Sports Medicine

## 2017-01-19 DIAGNOSIS — R6889 Other general symptoms and signs: Secondary | ICD-10-CM | POA: Diagnosis not present

## 2017-01-19 DIAGNOSIS — Z9079 Acquired absence of other genital organ(s): Secondary | ICD-10-CM | POA: Diagnosis not present

## 2017-01-19 DIAGNOSIS — F5101 Primary insomnia: Secondary | ICD-10-CM | POA: Diagnosis not present

## 2017-01-19 DIAGNOSIS — F321 Major depressive disorder, single episode, moderate: Secondary | ICD-10-CM | POA: Diagnosis not present

## 2017-01-19 MED ORDER — TRAZODONE HCL 50 MG PO TABS: 50.0000 mg | ORAL_TABLET | Freq: Every day | ORAL | 1 refills | Status: DC

## 2017-01-19 MED ORDER — ESCITALOPRAM OXALATE 20 MG PO TABS: 20.0000 mg | ORAL_TABLET | Freq: Every day | ORAL | 3 refills | Status: DC

## 2017-01-19 MED ORDER — SUVOREXANT 10 MG PO TABS: 1.0000 | ORAL_TABLET | Freq: Every day | ORAL | 0 refills | Status: DC

## 2017-01-19 NOTE — Assessment & Plan Note (Signed)
Discontinue Ambien, ineffective, he has taken more than the recommended dosage. Switching to Belsomra, 10 mg, and adding trazodone 50 mg at bedtime.

## 2017-01-19 NOTE — Assessment & Plan Note (Signed)
Starting the dementia workup. He is entering a study at [facility], which he tells me will include brain imaging so we will hold off on brain MRI for now. 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

## 2017-01-19 NOTE — Assessment & Plan Note (Signed)
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

## 2017-01-19 NOTE — Progress Notes (Signed)
  Subjective:    CC: Depression  HPI: Christian Lambert is a pleasant 76 year old male, we have been treating him for clinical depression now for a couple of months, he did not respond sufficiently to 5 mg of Lexapro, had a slight response to 10 mg, no suicidal or homicidal ideation.  He does continue to note significant insomnia in spite of Ambien, significant forgetfulness, word finding difficulties.  He is worried that he has dementia.  He is entering a study for dementia at [facility] and tells me that he will be getting brain imaging here for free so we do not need to order it.  On further questioning he is getting up multiple times at night to void, he does have a history of a radical prostatectomy, has not seen his urologist in a long time.  Past medical history:  Negative.  See flowsheet/record as well for more information.  Surgical history: Negative.  See flowsheet/record as well for more information.  Family history: Negative.  See flowsheet/record as well for more information.  Social history: Negative.  See flowsheet/record as well for more information.  Allergies, and medications have been entered into the medical record, reviewed, and no changes needed.   (To billers/coders, pertinent past medical, social, surgical, family history can be found in problem list, if problem list is marked as reviewed then this indicates that past medical, social, surgical, family history was also reviewed)  Review of Systems: No fevers, chills, night sweats, weight loss, chest pain, or shortness of breath.   Objective:    General: Well Developed, well nourished, and in no acute distress.  Neuro: Alert and oriented x3, extra-ocular muscles intact, sensation grossly intact.  HEENT: Normocephalic, atraumatic, pupils equal round reactive to light, neck supple, no masses, no lymphadenopathy, thyroid nonpalpable.  Skin: Warm and dry, no rashes. Cardiac: Regular rate and rhythm, no murmurs rubs or  gallops, no lower extremity edema.  Respiratory: Clear to auscultation bilaterally. Not using accessory muscles, speaking in full sentences.  Impression and Recommendations:    Depression, major, single episode, moderate (HCC) Minimal improvement in depression symptoms, good improvement in anxiety symptoms. Increasing Lexapro to 20 mg. Continues to have significant insomnia.  Insomnia Discontinue Ambien, ineffective, he has taken more than the recommended dosage. Switching to Belsomra, 10 mg, and adding trazodone 50 mg at bedtime.  Forgetfulness Starting the dementia workup. He is entering a study at [facility], which he tells me will include brain imaging so we will hold off on brain MRI for now. 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  ___________________________________________ Gwen Her. Dianah Field, M.D., ABFM., CAQSM. Primary Care and [place] Instructor of [place] of [facility] of Medicine

## 2017-01-19 NOTE — Assessment & Plan Note (Signed)
Minimal improvement in depression symptoms, good improvement in anxiety symptoms. Increasing Lexapro to 20 mg. Continues to have significant insomnia.

## 2017-01-20 LAB — RPR: RPR Ser Ql: NONREACTIVE

## 2017-01-20 LAB — CBC
HCT: 37.8 % — ABNORMAL LOW (ref 38.5–50.0)
Hemoglobin: 13.5 g/dL (ref 13.2–17.1)
MCH: 36.2 pg — ABNORMAL HIGH (ref 27.0–33.0)
MCHC: 35.7 g/dL (ref 32.0–36.0)
MCV: 101.3 fL — ABNORMAL HIGH (ref 80.0–100.0)
MPV: 10.3 fL (ref 7.5–12.5)
Platelets: 215 10*3/uL (ref 140–400)
RBC: 3.73 10*6/uL — ABNORMAL LOW (ref 4.20–5.80)
RDW: 15.1 % — ABNORMAL HIGH (ref 11.0–15.0)
WBC: 4.3 Thousand/uL (ref 3.8–10.8)

## 2017-01-20 LAB — VITAMIN B12: Vitamin B-12: 261 pg/mL (ref 200–1100)

## 2017-01-20 LAB — SEDIMENTATION RATE: Sed Rate: 2 mm/h (ref 0–20)

## 2017-01-20 LAB — FOLATE: Folate: 9.4 ng/mL

## 2017-01-20 LAB — TSH: TSH: 1.49 mIU/L (ref 0.40–4.50)

## 2017-01-21 ENCOUNTER — Telehealth: Payer: Self-pay

## 2017-01-21 NOTE — Telephone Encounter (Signed)
Pt left VM stating Belsomra is not helping and would like to know if he can have it increased and how would he get a new rx. Please advise.

## 2017-01-22 MED ORDER — SUVOREXANT 20 MG PO TABS: 1.0000 | ORAL_TABLET | Freq: Every day | ORAL | 0 refills | Status: DC

## 2017-01-22 NOTE — Telephone Encounter (Signed)
Have him take 2 pills at once, and I am going to send in another prescription for a 10-day trial of 20 mg.  He also needs to stick with it for longer.

## 2017-01-22 NOTE — Telephone Encounter (Signed)
Pt.notified

## 2017-02-05 ENCOUNTER — Telehealth: Payer: Self-pay

## 2017-02-05 ENCOUNTER — Telehealth: Payer: Self-pay | Admitting: Sports Medicine

## 2017-02-05 DIAGNOSIS — F5101 Primary insomnia: Secondary | ICD-10-CM

## 2017-02-05 NOTE — Telephone Encounter (Signed)
Pt called and stated he is still not able to sleep after increasing the dose of the Belsomra. He has stated that he had some Ambien left over and has been taking 1/2 a tab of that in addition to his Belsomra. He wants to know if this is safe or if there is any other options to help him sleep. He has an appointment with Dr. Darene Lamer on the 15th of Jan but didn't know if there was anything that can be given to him until then. Thanks

## 2017-02-06 MED ORDER — TRAZODONE HCL 100 MG PO TABS: 100.0000 mg | ORAL_TABLET | Freq: Every day | ORAL | 3 refills | Status: DC

## 2017-02-06 NOTE — Telephone Encounter (Signed)
This is fine.  If that works then we can leave it alone.  He should now be on Lexapro 20, Belsomra 20, and Trazodone 50.  I'm increasing Trazodone to 100mg  at bedtime.  OK to add the 1/2 [place].

## 2017-02-08 MED ORDER — ZOLPIDEM TARTRATE 5 MG PO TABS: 5.0000 mg | ORAL_TABLET | Freq: Every evening | ORAL | 1 refills | Status: DC | PRN

## 2017-02-08 NOTE — Addendum Note (Signed)
Addended by: Silverio Decamp on: 02/08/2017 12:58 PM   Modules accepted: Orders

## 2017-02-08 NOTE — Telephone Encounter (Signed)
Pt notified and verbalized understanding. Would like to know if he can have a refill of ambien because he's almost out. Please advise.

## 2017-02-08 NOTE — Telephone Encounter (Signed)
Left VM to call back 

## 2017-02-08 NOTE — Telephone Encounter (Signed)
Yes, I'm going to send in 5mg  since we are doing a half dose.

## 2017-02-16 ENCOUNTER — Ambulatory Visit: Payer: BLUE CROSS/BLUE SHIELD | Admitting: Sports Medicine

## 2017-02-18 ENCOUNTER — Encounter: Payer: Self-pay | Admitting: Sports Medicine

## 2017-02-18 ENCOUNTER — Ambulatory Visit (INDEPENDENT_AMBULATORY_CARE_PROVIDER_SITE_OTHER): Payer: BLUE CROSS/BLUE SHIELD | Admitting: Sports Medicine

## 2017-02-18 DIAGNOSIS — F321 Major depressive disorder, single episode, moderate: Secondary | ICD-10-CM | POA: Diagnosis not present

## 2017-02-18 MED ORDER — ZOLPIDEM TARTRATE ER 12.5 MG PO TBCR: 12.5000 mg | EXTENDED_RELEASE_TABLET | Freq: Every evening | ORAL | 3 refills | Status: DC | PRN

## 2017-02-18 NOTE — Assessment & Plan Note (Signed)
We have finally turned the corner, doing well on 20 mg of Lexapro, 100 of trazodone, 20 of Belsomra and 5 of Ambien. He still has some difficulty staying asleep so I am going to switch him to the continuous release Ambien. If it is too expensive we can go back to standard release Ambien at 10 mg.

## 2017-02-18 NOTE — Progress Notes (Signed)
Subjective:    CC: Recheck anxiety and depression  HPI: Zacharey is an exquisitely pleasant 77 year old male, we have been working hard to control his anxiety, depression and insomnia.  At the last visit we bumped up his Lexapro to 20 mg, increase his trazodone to 100 mg, and restarted his Ambien but at a lower dose.  He is also using Belsomra.  He returns today feeling good, happy, he is active, getting back to doing things he likes to do, his sleep has improved considerably but he still feels as though he can get to sleep but cannot maintain sleep on the 5 mg of Ambien, 100 of trazodone and 20 of Belsomra.  His sleep hygiene has been appropriate.  He does wake occasionally to void but usually it simply because he cannot stay sleeping.  I reviewed the past medical history, family history, social history, surgical history, and allergies today and no changes were needed.  Please see the problem list section below in epic for further details.  Past Medical History: Past Medical History:  Diagnosis Date  . Hyperlipidemia    Past Surgical History: Past Surgical History:  Procedure Laterality Date  . broken leg    . PROSTATECTOMY     Social History: Social History   Socioeconomic History  . Marital status: Married    Spouse name: None  . Number of children: None  . Years of education: None  . Highest education level: None  Social Needs  . Financial resource strain: None  . Food insecurity - worry: None  . Food insecurity - inability: None  . Transportation needs - medical: None  . Transportation needs - non-medical: None  Occupational History  . None  Tobacco Use  . Smoking status: Former Research scientist (life sciences)  . Smokeless tobacco: Never Used  Substance and Sexual Activity  . Alcohol use: No  . Drug use: None  . Sexual activity: None  Other Topics Concern  . None  Social History Narrative  . None   Family History: Family History  Problem Relation Age of Onset  . Alcohol abuse Mother     . Cancer Mother   . Depression Mother   . Alcohol abuse Father   . Cancer Maternal Aunt   . Alcohol abuse Maternal Uncle   . Cancer Maternal Uncle   . Diabetes Maternal Grandmother    Allergies: Allergies  Allergen Reactions  . Imodium A-D [Loperamide Hcl] Swelling    Causes throat swelling    Medications: See med rec.  Review of Systems: No fevers, chills, night sweats, weight loss, chest pain, or shortness of breath.   Objective:    General: Well Developed, well nourished, and in no acute distress.  Neuro: Alert and oriented x3, extra-ocular muscles intact, sensation grossly intact.  HEENT: Normocephalic, atraumatic, pupils equal round reactive to light, neck supple, no masses, no lymphadenopathy, thyroid nonpalpable.  Skin: Warm and dry, no rashes. Cardiac: Regular rate and rhythm, no murmurs rubs or gallops, no lower extremity edema.  Respiratory: Clear to auscultation bilaterally. Not using accessory muscles, speaking in full sentences.  Impression and Recommendations:    Depression, major, single episode, moderate (HCC) We have finally turned the corner, doing well on 20 mg of Lexapro, 100 of trazodone, 20 of Belsomra and 5 of Ambien. He still has some difficulty staying asleep so I am going to switch him to the continuous release Ambien. If it is too expensive we can go back to standard release Ambien at 10 mg.  I  spent 25 minutes with this patient, greater than 50% was face-to-face time counseling regarding the above diagnoses ___________________________________________ Gwen Her. Dianah Field, M.D., ABFM., CAQSM. Primary Care and [place] Instructor of [place] of [facility] of Medicine

## 2017-02-24 ENCOUNTER — Telehealth: Payer: Self-pay

## 2017-02-24 DIAGNOSIS — L718 Other rosacea: Secondary | ICD-10-CM | POA: Insufficient documentation

## 2017-02-24 MED ORDER — METRONIDAZOLE 0.75 % EX GEL: 1.0000 "application " | Freq: Two times a day (BID) | CUTANEOUS | 3 refills | Status: DC

## 2017-02-24 NOTE — Telephone Encounter (Signed)
Pt left VM stating he did not get the medication for rosacea. Please assist.

## 2017-02-24 NOTE — Telephone Encounter (Signed)
Yes, he is right, I did not send it in, I have sent metronidazole gel to the pharmacy today.

## 2017-02-24 NOTE — Assessment & Plan Note (Signed)
Adding topical metronidazole gel.

## 2017-02-27 ENCOUNTER — Other Ambulatory Visit: Payer: Self-pay | Admitting: Sports Medicine

## 2017-03-09 ENCOUNTER — Ambulatory Visit (INDEPENDENT_AMBULATORY_CARE_PROVIDER_SITE_OTHER): Payer: BLUE CROSS/BLUE SHIELD | Admitting: Sports Medicine

## 2017-03-09 ENCOUNTER — Encounter: Payer: Self-pay | Admitting: Sports Medicine

## 2017-03-09 DIAGNOSIS — K219 Gastro-esophageal reflux disease without esophagitis: Secondary | ICD-10-CM | POA: Insufficient documentation

## 2017-03-09 DIAGNOSIS — M67449 Ganglion, unspecified hand: Secondary | ICD-10-CM | POA: Diagnosis not present

## 2017-03-09 DIAGNOSIS — N139 Obstructive and reflux uropathy, unspecified: Secondary | ICD-10-CM

## 2017-03-09 HISTORY — DX: Gastro-esophageal reflux disease without esophagitis: K21.9

## 2017-03-09 MED ORDER — DUTASTERIDE 0.5 MG PO CAPS: 0.5000 mg | ORAL_CAPSULE | Freq: Every day | ORAL | 3 refills | Status: DC

## 2017-03-09 MED ORDER — PANTOPRAZOLE SODIUM 40 MG PO TBEC: 40.0000 mg | DELAYED_RELEASE_TABLET | Freq: Two times a day (BID) | ORAL | 3 refills | Status: DC

## 2017-03-09 NOTE — Progress Notes (Signed)
Subjective:    CC: Several issues  HPI: Lump on finger: Dorsal fourth, painless.  Obstructive uropathy: On further questioning he has not had a radical prostatectomy, had some nodules removed, does have urgency, frequency, nocturia.  Flomax really did not work, agreeable to start something different and get a second opinion from urology.  GERD: Recent increase in sour brash, no melena, hematochezia, hematemesis, no severe epigastric pain.  I reviewed the past medical history, family history, social history, surgical history, and allergies today and no changes were needed.  Please see the problem list section below in epic for further details.  Past Medical History: Past Medical History:  Diagnosis Date  . Hyperlipidemia    Past Surgical History: Past Surgical History:  Procedure Laterality Date  . broken leg    . PROSTATECTOMY     Social History: Social History   Socioeconomic History  . Marital status: Married    Spouse name: None  . Number of children: None  . Years of education: None  . Highest education level: None  Social Needs  . Financial resource strain: None  . Food insecurity - worry: None  . Food insecurity - inability: None  . Transportation needs - medical: None  . Transportation needs - non-medical: None  Occupational History  . None  Tobacco Use  . Smoking status: Former Research scientist (life sciences)  . Smokeless tobacco: Never Used  Substance and Sexual Activity  . Alcohol use: No  . Drug use: None  . Sexual activity: None  Other Topics Concern  . None  Social History Narrative  . None   Family History: Family History  Problem Relation Age of Onset  . Alcohol abuse Mother   . Cancer Mother   . Depression Mother   . Alcohol abuse Father   . Cancer Maternal Aunt   . Alcohol abuse Maternal Uncle   . Cancer Maternal Uncle   . Diabetes Maternal Grandmother    Allergies: Allergies  Allergen Reactions  . Imodium A-D [Loperamide Hcl] Swelling    Causes throat  swelling    Medications: See med rec.  Review of Systems: No fevers, chills, night sweats, weight loss, chest pain, or shortness of breath.   Objective:    General: Well Developed, well nourished, and in no acute distress.  Neuro: Alert and oriented x3, extra-ocular muscles intact, sensation grossly intact.  HEENT: Normocephalic, atraumatic, pupils equal round reactive to light, neck supple, no masses, no lymphadenopathy, thyroid nonpalpable.  Skin: Warm and dry, no rashes. Cardiac: Regular rate and rhythm, no murmurs rubs or gallops, no lower extremity edema.  Respiratory: Clear to auscultation bilaterally. Not using accessory muscles, speaking in full sentences. Abdomen: Soft, nontender, nondistended, normal bowel sounds, no palpable masses, no guarding, rigidity, rebound tenderness. Left hand: Palpable ganglion over the dorsal fourth finger.  Impression and Recommendations:    Ganglion cyst of finger Dorsal PIP extensor ganglion cyst. Asymptomatic, no treatment needed.  Obstructive uropathy On further questioning he now tells me he has never had a radical prostatectomy, he is having multiple episodes of nocturia. We have tried Flomax without much efficacy. I am going to add Avodart, and he will touch base with urology.  GERD (gastroesophageal reflux disease) Having a significant increase in sour brash. Discussed avoiding eating before bedtime. Adding Protonix twice a day. Considering the change in symptoms I would like gastroenterology to weigh in. ___________________________________________ Gwen Her. Dianah Field, M.D., ABFM., CAQSM. Primary Care and [place]  Adjunct Instructor of Family  Medicine  [employer]

## 2017-03-09 NOTE — Assessment & Plan Note (Signed)
Having a significant increase in sour brash. Discussed avoiding eating before bedtime. Adding Protonix twice a day. Considering the change in symptoms I would like gastroenterology to weigh in.

## 2017-03-09 NOTE — Assessment & Plan Note (Signed)
Dorsal PIP extensor ganglion cyst. Asymptomatic, no treatment needed.

## 2017-03-09 NOTE — Assessment & Plan Note (Signed)
On further questioning he now tells me he has never had a radical prostatectomy, he is having multiple episodes of nocturia. We have tried Flomax without much efficacy. I am going to add Avodart, and he will touch base with urology.

## 2017-03-10 ENCOUNTER — Telehealth: Payer: Self-pay | Admitting: Sports Medicine

## 2017-03-10 NOTE — Telephone Encounter (Signed)
Received prior auth for Dutasteride ( Avodart) from [facility].  Prior auth filled out on Cover my meds and awaiting decision. KG LPN

## 2017-03-11 NOTE — Telephone Encounter (Signed)
I should have dictated it, he has tried and failed finasteride.

## 2017-03-11 NOTE — Telephone Encounter (Signed)
Received fax from [place] and they denied coverage on dutasteride due to patient has not tried brand or generic finasteride. - CF

## 2017-03-12 ENCOUNTER — Encounter: Payer: Self-pay | Admitting: Sports Medicine

## 2017-03-12 NOTE — Telephone Encounter (Signed)
I could not run back through cover my meds so I did an appeal and faxed it to the insurance now waiting on determination. - CF

## 2017-03-12 NOTE — Telephone Encounter (Signed)
Oh thank you very much Jenny Reichmann!

## 2017-03-12 NOTE — Telephone Encounter (Signed)
I did not do this [place] but I will try to resend it and see if we can get it approved. - CF

## 2017-03-16 ENCOUNTER — Ambulatory Visit: Payer: BLUE CROSS/BLUE SHIELD | Admitting: Osteopathic Medicine

## 2017-03-24 NOTE — Telephone Encounter (Signed)
Received fax from [place] that Dutasteride was approved from 02/26/2017 until 03/23/2018. Pharmacy notified. Forms sent to scan.  Reference Number: [id number]

## 2017-05-01 ENCOUNTER — Other Ambulatory Visit: Payer: Self-pay | Admitting: Sports Medicine

## 2017-05-01 DIAGNOSIS — F321 Major depressive disorder, single episode, moderate: Secondary | ICD-10-CM

## 2017-05-28 ENCOUNTER — Ambulatory Visit (INDEPENDENT_AMBULATORY_CARE_PROVIDER_SITE_OTHER): Payer: BLUE CROSS/BLUE SHIELD | Admitting: Sports Medicine

## 2017-05-28 ENCOUNTER — Encounter: Payer: Self-pay | Admitting: Sports Medicine

## 2017-05-28 DIAGNOSIS — Z0184 Encounter for antibody response examination: Secondary | ICD-10-CM | POA: Diagnosis not present

## 2017-05-28 NOTE — Progress Notes (Signed)
Subjective:    CC: Concerns about measles  HPI: This is a pleasant 77 year old male, with the resurgence of measles in the news he is concerned about whether or not he is immune.  He and his wife believe they have been fully vaccinated his children but are wondering if they need another measles vaccine.  I reviewed the past medical history, family history, social history, surgical history, and allergies today and no changes were needed.  Please see the problem list section below in epic for further details.  Past Medical History: Past Medical History:  Diagnosis Date  . Hyperlipidemia    Past Surgical History: Past Surgical History:  Procedure Laterality Date  . broken leg    . PROSTATECTOMY     Social History: Social History   Socioeconomic History  . Marital status: Married    Spouse name: Not on file  . Number of children: Not on file  . Years of education: Not on file  . Highest education level: Not on file  Occupational History  . Not on file  Social Needs  . Financial resource strain: Not on file  . Food insecurity:    Worry: Not on file    Inability: Not on file  . Transportation needs:    Medical: Not on file    Non-medical: Not on file  Tobacco Use  . Smoking status: Former Research scientist (life sciences)  . Smokeless tobacco: Never Used  Substance and Sexual Activity  . Alcohol use: No  . Drug use: Not on file  . Sexual activity: Not on file  Lifestyle  . Physical activity:    Days per week: Not on file    Minutes per session: Not on file  . Stress: Not on file  Relationships  . Social connections:    Talks on phone: Not on file    Gets together: Not on file    Attends religious service: Not on file    Active member of club or organization: Not on file    Attends meetings of clubs or organizations: Not on file    Relationship status: Not on file  Other Topics Concern  . Not on file  Social History Narrative  . Not on file   Family History: Family History  Problem  Relation Age of Onset  . Alcohol abuse Mother   . Cancer Mother   . Depression Mother   . Alcohol abuse Father   . Cancer Maternal Aunt   . Alcohol abuse Maternal Uncle   . Cancer Maternal Uncle   . Diabetes Maternal Grandmother    Allergies: Allergies  Allergen Reactions  . Imodium A-D [Loperamide Hcl] Swelling    Causes throat swelling    Medications: See med rec.  Review of Systems: No fevers, chills, night sweats, weight loss, chest pain, or shortness of breath.   Objective:    General: Well Developed, well nourished, and in no acute distress.  Neuro: Alert and oriented x3, extra-ocular muscles intact, sensation grossly intact.  HEENT: Normocephalic, atraumatic, pupils equal round reactive to light, neck supple, no masses, no lymphadenopathy, thyroid nonpalpable.  Skin: Warm and dry, no rashes. Cardiac: Regular rate and rhythm, no murmurs rubs or gallops, no lower extremity edema.  Respiratory: Clear to auscultation bilaterally. Not using accessory muscles, speaking in full sentences.  Impression and Recommendations:    Immunity status testing To recent measles outbreak patient would like immune status testing for measles, checking MMR antibodies. He is up-to-date on everything else. ___________________________________________ Gwen Her. Dianah Field,  M.D., ABFM., CAQSM. Primary Care and [place] Instructor of [place] [employer] [facility] [employer]

## 2017-05-28 NOTE — Assessment & Plan Note (Signed)
To recent measles outbreak patient would like immune status testing for measles, checking MMR antibodies. He is up-to-date on everything else.

## 2017-05-31 LAB — VARICELLA ZOSTER ANTIBODY, IGG: Varicella IgG: 1509 index

## 2017-05-31 LAB — MEASLES/MUMPS/RUBELLA IMMUNITY
Mumps IgG: 44 [AU]/ml
Rubella: 17.2 {index}
Rubeola IgG: >300 AU/mL

## 2017-06-02 ENCOUNTER — Other Ambulatory Visit: Payer: Self-pay | Admitting: Sports Medicine

## 2017-06-02 DIAGNOSIS — F5101 Primary insomnia: Secondary | ICD-10-CM

## 2017-06-13 ENCOUNTER — Other Ambulatory Visit: Payer: Self-pay | Admitting: Sports Medicine

## 2017-06-13 DIAGNOSIS — F321 Major depressive disorder, single episode, moderate: Secondary | ICD-10-CM

## 2017-06-17 ENCOUNTER — Encounter: Payer: Self-pay | Admitting: Sports Medicine

## 2017-06-17 ENCOUNTER — Ambulatory Visit (INDEPENDENT_AMBULATORY_CARE_PROVIDER_SITE_OTHER): Payer: BLUE CROSS/BLUE SHIELD | Admitting: Sports Medicine

## 2017-06-17 DIAGNOSIS — D7589 Other specified diseases of blood and blood-forming organs: Secondary | ICD-10-CM

## 2017-06-17 DIAGNOSIS — A0472 Enterocolitis due to Clostridium difficile, not specified as recurrent: Secondary | ICD-10-CM

## 2017-06-17 DIAGNOSIS — R634 Abnormal weight loss: Secondary | ICD-10-CM

## 2017-06-17 MED ORDER — DICYCLOMINE HCL 10 MG PO CAPS: 10.0000 mg | ORAL_CAPSULE | Freq: Three times a day (TID) | ORAL | 3 refills | Status: DC

## 2017-06-17 NOTE — Assessment & Plan Note (Signed)
History of C. difficile colitis, rechecking stool studies. Adding a bit of Bentyl to slow down the diarrhea, if present we will probably have to permanently discontinue his PPI.

## 2017-06-17 NOTE — Assessment & Plan Note (Signed)
Adding some routine labs, he is up-to-date on age-appropriate cancer screening. No cervical, axillary or inguinal lymphadenopathy.

## 2017-06-17 NOTE — Progress Notes (Addendum)
Subjective:    CC: Weight loss, diarrhea  HPI: This is a pleasant 77 year old male, for some time now he has been taking a PPI, has noted increasing diarrhea.  Was seen by GI, upper endoscopy showed some areas of esophagitis, gastritis.  Biopsies were taken, he also had some labs done including C. difficile testing, fecal fat, pancreatic enzyme testing all of which was negative.  Unfortunately he is continued to have some diarrhea, loose stools.  Feels as though the Protonix is worsening his symptoms.  No melena, hematochezia, hematemesis.  Mood is good, he has been trying to lose some weight.  I reviewed the past medical history, family history, social history, surgical history, and allergies today and no changes were needed.  Please see the problem list section below in epic for further details.  Past Medical History: Past Medical History:  Diagnosis Date  . Hyperlipidemia    Past Surgical History: Past Surgical History:  Procedure Laterality Date  . broken leg    . PROSTATECTOMY     Social History: Social History   Socioeconomic History  . Marital status: Married    Spouse name: Not on file  . Number of children: Not on file  . Years of education: Not on file  . Highest education level: Not on file  Occupational History  . Not on file  Social Needs  . Financial resource strain: Not on file  . Food insecurity:    Worry: Not on file    Inability: Not on file  . Transportation needs:    Medical: Not on file    Non-medical: Not on file  Tobacco Use  . Smoking status: Former Research scientist (life sciences)  . Smokeless tobacco: Never Used  Substance and Sexual Activity  . Alcohol use: No  . Drug use: Not on file  . Sexual activity: Not on file  Lifestyle  . Physical activity:    Days per week: Not on file    Minutes per session: Not on file  . Stress: Not on file  Relationships  . Social connections:    Talks on phone: Not on file    Gets together: Not on file    Attends religious  service: Not on file    Active member of club or organization: Not on file    Attends meetings of clubs or organizations: Not on file    Relationship status: Not on file  Other Topics Concern  . Not on file  Social History Narrative  . Not on file   Family History: Family History  Problem Relation Age of Onset  . Alcohol abuse Mother   . Cancer Mother   . Depression Mother   . Alcohol abuse Father   . Cancer Maternal Aunt   . Alcohol abuse Maternal Uncle   . Cancer Maternal Uncle   . Diabetes Maternal Grandmother    Allergies: Allergies  Allergen Reactions  . Imodium A-D [Loperamide Hcl] Swelling    Causes throat swelling    Medications: See med rec.  Review of Systems: No fevers, chills, night sweats, weight loss, chest pain, or shortness of breath.   Objective:    General: Well Developed, well nourished, and in no acute distress.  Neuro: Alert and oriented x3, extra-ocular muscles intact, sensation grossly intact.  HEENT: Normocephalic, atraumatic, pupils equal round reactive to light, neck supple, no masses, no lymphadenopathy, thyroid nonpalpable.  No cervical, axillary, inguinal lymphadenopathy. Skin: Warm and dry, no rashes. Cardiac: Regular rate and rhythm, no murmurs rubs or  gallops, no lower extremity edema.  Respiratory: Clear to auscultation bilaterally. Not using accessory muscles, speaking in full sentences. Abdomen: Soft, nontender, nondistended, no bowel sounds, no palpable masses, guarding, rigidity, rebound tenderness.  Impression and Recommendations:    Enteritis due to Clostridium difficile History of C. difficile colitis, rechecking stool studies. Adding a bit of Bentyl to slow down the diarrhea, if present we will probably have to permanently discontinue his PPI.   Weight loss Adding some routine labs, he is up-to-date on age-appropriate cancer screening. No cervical, axillary or inguinal lymphadenopathy.  Macrocytosis without anemia Now  macrocytosis with anemia. Adding a B12, folate levels, reticulocyte counts.  ___________________________________________ Gwen Her. Dianah Field, M.D., ABFM., CAQSM. Primary Care and [place] Instructor of [place] of [facility] [employer]

## 2017-06-18 NOTE — Addendum Note (Signed)
Addended by: Silverio Decamp on: 06/18/2017 05:02 PM   Modules accepted: Orders

## 2017-06-18 NOTE — Assessment & Plan Note (Signed)
Now macrocytosis with anemia. Adding a B12, folate levels, reticulocyte counts.

## 2017-06-19 LAB — C. DIFFICILE GDH AND TOXIN A/B
GDH ANTIGEN: NOT DETECTED
MICRO NUMBER:: 90606087
SPECIMEN QUALITY:: ADEQUATE
TOXIN A AND B: NOT DETECTED

## 2017-06-24 LAB — COMPREHENSIVE METABOLIC PANEL
AST: 19 U/L (ref 10–35)
Albumin: 4.2 g/dL (ref 3.6–5.1)
Alkaline phosphatase (APISO): 72 U/L (ref 40–115)
BUN: 13 mg/dL (ref 7–25)
CO2: 30 mmol/L (ref 20–32)
Calcium: 9.1 mg/dL (ref 8.6–10.3)
Chloride: 105 mmol/L (ref 98–110)
Creat: 0.78 mg/dL (ref 0.70–1.18)
Globulin: 2.3 g/dL (calc) (ref 1.9–3.7)
Potassium: 4.6 mmol/L (ref 3.5–5.3)
Sodium: 140 mmol/L (ref 135–146)
Total Bilirubin: 0.7 mg/dL (ref 0.2–1.2)

## 2017-06-24 LAB — COMPREHENSIVE METABOLIC PANEL WITH GFR
AG Ratio: 1.8 (calc) (ref 1.0–2.5)
ALT: 12 U/L (ref 9–46)
Glucose, Bld: 95 mg/dL (ref 65–99)
Total Protein: 6.5 g/dL (ref 6.1–8.1)

## 2017-06-24 LAB — CBC
HCT: 36.3 % — ABNORMAL LOW (ref 38.5–50.0)
Hemoglobin: 12.9 g/dL — ABNORMAL LOW (ref 13.2–17.1)
MCH: 35.8 pg — ABNORMAL HIGH (ref 27.0–33.0)
MCHC: 35.5 g/dL (ref 32.0–36.0)
MCV: 100.8 fL — ABNORMAL HIGH (ref 80.0–100.0)
MPV: 10.4 fL (ref 7.5–12.5)
Platelets: 218 Thousand/uL (ref 140–400)
RBC: 3.6 Million/uL — ABNORMAL LOW (ref 4.20–5.80)
RDW: 15.2 % — ABNORMAL HIGH (ref 11.0–15.0)
WBC: 3.3 10*3/uL — ABNORMAL LOW (ref 3.8–10.8)

## 2017-06-24 LAB — HEMOGLOBIN A1C
Hgb A1c MFr Bld: 4.9 % of total Hgb (ref <5.7)
Mean Plasma Glucose: 94 (calc)
eAG (mmol/L): 5.2 (calc)

## 2017-06-24 LAB — VITAMIN B12: Vitamin B-12: 439 pg/mL (ref 200–1100)

## 2017-06-24 LAB — T3, FREE: T3, Free: 2.8 pg/mL (ref 2.3–4.2)

## 2017-06-24 LAB — IRON, TOTAL/TOTAL IRON BINDING CAP
%SAT: 51 % (calc) (ref 15–60)
Iron: 185 ug/dL — ABNORMAL HIGH (ref 50–180)
TIBC: 362 mcg/dL (calc) (ref 250–425)

## 2017-06-24 LAB — RETICULOCYTES

## 2017-06-24 LAB — FERRITIN: Ferritin: 211 ng/mL (ref 20–380)

## 2017-06-24 LAB — TSH: TSH: 1.6 m[IU]/L (ref 0.40–4.50)

## 2017-06-24 LAB — T4, FREE: Free T4: 1.2 ng/dL (ref 0.8–1.8)

## 2017-06-24 LAB — FOLATE: Folate: 5.9 ng/mL

## 2017-06-29 ENCOUNTER — Ambulatory Visit (INDEPENDENT_AMBULATORY_CARE_PROVIDER_SITE_OTHER): Payer: BLUE CROSS/BLUE SHIELD | Admitting: Sports Medicine

## 2017-06-29 ENCOUNTER — Encounter: Payer: Self-pay | Admitting: Sports Medicine

## 2017-06-29 DIAGNOSIS — D7589 Other specified diseases of blood and blood-forming organs: Secondary | ICD-10-CM | POA: Diagnosis not present

## 2017-06-29 DIAGNOSIS — A0472 Enterocolitis due to Clostridium difficile, not specified as recurrent: Secondary | ICD-10-CM

## 2017-06-29 MED ORDER — VITAMIN B-12 1000 MCG PO TABS: 1000.0000 ug | ORAL_TABLET | Freq: Every day | ORAL | 11 refills | Status: DC

## 2017-06-29 NOTE — Assessment & Plan Note (Signed)
C. difficile studies were negative, Bentyl has resolved his diarrhea, return to see me in 6 months.

## 2017-06-29 NOTE — Progress Notes (Signed)
Subjective:    CC: Follow-up  HPI: Darol returns, diarrhea has resolved.  In addition we uncovered some anemia on his routine labs, folate, B12 levels were normal, iron indices were normal suggesting that this is more of a myelodysplastic syndrome.  Overall he is not symptomatic, and understands we can just watch this for now.  I reviewed the past medical history, family history, social history, surgical history, and allergies today and no changes were needed.  Please see the problem list section below in epic for further details.  Past Medical History: Past Medical History:  Diagnosis Date  . Hyperlipidemia    Past Surgical History: Past Surgical History:  Procedure Laterality Date  . broken leg    . PROSTATECTOMY     Social History: Social History   Socioeconomic History  . Marital status: Married    Spouse name: Not on file  . Number of children: Not on file  . Years of education: Not on file  . Highest education level: Not on file  Occupational History  . Not on file  Social Needs  . Financial resource strain: Not on file  . Food insecurity:    Worry: Not on file    Inability: Not on file  . Transportation needs:    Medical: Not on file    Non-medical: Not on file  Tobacco Use  . Smoking status: Former Research scientist (life sciences)  . Smokeless tobacco: Never Used  Substance and Sexual Activity  . Alcohol use: No  . Drug use: Not on file  . Sexual activity: Not on file  Lifestyle  . Physical activity:    Days per week: Not on file    Minutes per session: Not on file  . Stress: Not on file  Relationships  . Social connections:    Talks on phone: Not on file    Gets together: Not on file    Attends religious service: Not on file    Active member of club or organization: Not on file    Attends meetings of clubs or organizations: Not on file    Relationship status: Not on file  Other Topics Concern  . Not on file  Social History Narrative  . Not on file   Family  History: Family History  Problem Relation Age of Onset  . Alcohol abuse Mother   . Cancer Mother   . Depression Mother   . Alcohol abuse Father   . Cancer Maternal Aunt   . Alcohol abuse Maternal Uncle   . Cancer Maternal Uncle   . Diabetes Maternal Grandmother    Allergies: Allergies  Allergen Reactions  . Imodium A-D [Loperamide Hcl] Swelling    Causes throat swelling    Medications: See med rec.  Review of Systems: No fevers, chills, night sweats, weight loss, chest pain, or shortness of breath.   Objective:    General: Well Developed, well nourished, and in no acute distress.  Neuro: Alert and oriented x3, extra-ocular muscles intact, sensation grossly intact.  HEENT: Normocephalic, atraumatic, pupils equal round reactive to light, neck supple, no masses, no lymphadenopathy, thyroid nonpalpable.  Skin: Warm and dry, no rashes. Cardiac: Regular rate and rhythm, no murmurs rubs or gallops, no lower extremity edema.  Respiratory: Clear to auscultation bilaterally. Not using accessory muscles, speaking in full sentences.  Impression and Recommendations:    Macrocytosis without anemia Normal B12, folate, iron indices, this is likely myelodysplastic syndrome. Levels are not low enough to consider Aranesp, but we will check this again  in 6 months.  Enteritis due to Clostridium difficile C. difficile studies were negative, Bentyl has resolved his diarrhea, return to see me in 6 months. ___________________________________________ Gwen Her. Dianah Field, M.D., ABFM., CAQSM. Primary Care and [place] Instructor of [place] of [facility]

## 2017-06-29 NOTE — Assessment & Plan Note (Signed)
Normal B12, folate, iron indices, this is likely myelodysplastic syndrome. Levels are not low enough to consider Aranesp, but we will check this again in 6 months.

## 2017-06-30 ENCOUNTER — Other Ambulatory Visit: Payer: Self-pay | Admitting: Sports Medicine

## 2017-06-30 DIAGNOSIS — K219 Gastro-esophageal reflux disease without esophagitis: Secondary | ICD-10-CM

## 2017-07-26 ENCOUNTER — Other Ambulatory Visit: Payer: Self-pay | Admitting: Sports Medicine

## 2017-07-26 DIAGNOSIS — N139 Obstructive and reflux uropathy, unspecified: Secondary | ICD-10-CM

## 2017-08-11 ENCOUNTER — Other Ambulatory Visit: Payer: Self-pay | Admitting: Sports Medicine

## 2017-08-11 DIAGNOSIS — F321 Major depressive disorder, single episode, moderate: Secondary | ICD-10-CM

## 2017-08-16 ENCOUNTER — Other Ambulatory Visit: Payer: Self-pay | Admitting: Sports Medicine

## 2017-08-21 ENCOUNTER — Other Ambulatory Visit: Payer: Self-pay | Admitting: Sports Medicine

## 2017-08-25 ENCOUNTER — Encounter: Payer: Self-pay | Admitting: Family Medicine

## 2017-08-25 ENCOUNTER — Ambulatory Visit (INDEPENDENT_AMBULATORY_CARE_PROVIDER_SITE_OTHER): Payer: BLUE CROSS/BLUE SHIELD | Admitting: Family Medicine

## 2017-08-25 VITALS — BP 133/70 | Ht 73.0 in | Wt 165.0 lb

## 2017-08-25 DIAGNOSIS — T63301A Toxic effect of unspecified spider venom, accidental (unintentional), initial encounter: Secondary | ICD-10-CM | POA: Diagnosis not present

## 2017-08-25 MED ORDER — TRIAMCINOLONE ACETONIDE 0.5 % EX CREA: 1.0000 "application " | TOPICAL_CREAM | Freq: Two times a day (BID) | CUTANEOUS | 3 refills | Status: DC

## 2017-08-25 MED ORDER — ZOSTER VAC RECOMB ADJUVANTED 50 MCG/0.5ML IM SUSR: 0.5000 mL | Freq: Once | INTRAMUSCULAR | 1 refills | Status: AC

## 2017-08-25 NOTE — Progress Notes (Signed)
Christian Lambert is a 77 y.o. male who presents to [facility] today for spider bite right leg. Christian Lambert was in his normal state of health cleaning up some fallen wood in the backyard when he disturbed a small brown spider.  He was trying to discourage inspired her from running into the yard and it ran up his leg.  He felt pain on his anterior right shin.  He notes that it was mildly red and tender initially and now few days later is fading and not particularly painful.  He notes it is occasionally itchy.  He is been using some antibiotic ointment on it which is helped a little.  He feels fine with no fevers or chills. After showing him pictures of spiders he thinks it was a wolf spider.   ROS as above:  Exam:  BP 133/70   Ht 6\' 1"  (1.854 m)   Wt 165 lb (74.8 kg)   BMI 21.77 kg/m  Gen: Well NAD HEENT: EOMI,  MMM Lungs: Normal work of breathing. CTABL Heart: RRR no MRG Abd: NABS, Soft. Nondistended, Nontender Exts: Brisk capillary refill, warm and well perfused.  Right leg skin: Small macular erythematous patch right leg approximately 1 cm in diameter.  Nontender.      Lab and Radiology Results No results found for this or any previous visit (from the past 72 hour(s)). No results found.    Assessment and Plan: 77 y.o. male with spider bite right leg likely not brown recluse.  Doing quite well.  Plan for symptomatic management.  Use triamcinolone cream as needed.  Recheck with PCP as scheduled or sooner if needed.  Precautions reviewed.  Shingrix vaccine sent to pharmacy.   No orders of the defined types were placed in this encounter.  Meds ordered this encounter  Medications  . triamcinolone cream (KENALOG) 0.5 %    Sig: Apply 1 application topically 2 (two) times daily. To affected areas.    Dispense:  30 g    Refill:  3  . Zoster Vaccine Adjuvanted  [facility]) injection    Sig: Inject 0.5 mLs into the muscle once for 1 dose.    Dispense:  0.5 mL    Refill:  1    When given fax note to Dr T [phone]     Historical information moved to improve visibility of documentation.  Past Medical History:  Diagnosis Date  . Depression, major, single episode, moderate ([place]) 11/24/2016   11/24/2016 PHQ9 = 21, GAD7 = 12 12/22/2016 PHQ 9 = 16, GAD 7 = 11 01/19/2017 PHQ 9 = 14, GAD 7 = 6 02/18/2017 PHQ 9 = 9, GAD 7 = 2  . Enteritis due to Clostridium difficile 11/09/2013  . GERD (gastroesophageal reflux disease) 03/09/2017  . Hyperlipidemia    Past Surgical History:  Procedure Laterality Date  . broken leg    . PROSTATECTOMY     Social History   Tobacco Use  . Smoking status: Former Research scientist (life sciences)  . Smokeless tobacco: Never Used  Substance Use Topics  . Alcohol use: No   family history includes Alcohol abuse in his father, maternal uncle, and mother; Cancer in his maternal aunt, maternal uncle, and mother; Depression in his mother; Diabetes in his maternal grandmother.  Medications: Current Outpatient Medications  Medication Sig Dispense Refill  . dicyclomine (BENTYL) 10 MG capsule Take 1 capsule (10 mg total) by mouth 4 (four) times daily -  before meals and  at bedtime. 90 capsule 3  . dutasteride (AVODART) 0.5 MG capsule TAKE 1 CAPSULE (0.5 MG TOTAL) BY MOUTH DAILY. 30 capsule 3  . escitalopram (LEXAPRO) 20 MG tablet Take 1 tablet (20 mg total) by mouth daily. 30 tablet 3  . gemfibrozil (LOPID) 600 MG tablet TAKE 1 TABLET TWICE A DAY WITH FOOD 180 tablet 3  . pantoprazole (PROTONIX) 40 MG tablet TAKE 1 TABLET (40 MG TOTAL) BY MOUTH 2 (TWO) TIMES DAILY BEFORE A MEAL. 60 tablet 3  . Suvorexant (BELSOMRA) 20 MG TABS Take 1 tablet by mouth at bedtime. 10 tablet 0  . TIMOLOL MALEATE OP Apply to eye.    . traZODone (DESYREL) 100 MG tablet TAKE 1 TABLET BY MOUTH EVERYDAY AT BEDTIME 30 tablet 3  . vitamin B-12 (CYANOCOBALAMIN) 1000 MCG tablet Take 1  tablet (1,000 mcg total) by mouth daily. 30 tablet 11  . zolpidem (AMBIEN CR) 12.5 MG CR tablet TAKE 1 TABLET (12.5 MG TOTAL) BY MOUTH AT BEDTIME AS NEEDED FOR SLEEP. 30 tablet 1  . zolpidem (AMBIEN) 5 MG tablet TAKE 1 TABLET (5 MG TOTAL) BY MOUTH AT BEDTIME AS NEEDED FOR SLEEP. 30 tablet 0  . triamcinolone cream (KENALOG) 0.5 % Apply 1 application topically 2 (two) times daily. To affected areas. 30 g 3  . Zoster Vaccine Adjuvanted [facility]) injection Inject 0.5 mLs into the muscle once for 1 dose. 0.5 mL 1   No current facility-administered medications for this visit.    Allergies  Allergen Reactions  . Imodium A-D [Loperamide Hcl] Swelling    Causes throat swelling      Discussed warning signs or symptoms. Please see discharge instructions. Patient expresses understanding.

## 2017-08-25 NOTE — Patient Instructions (Signed)
Thank you for coming in today. Use the triamcinolone cream for itching as needed.  Recheck if worsening or not doing well.   This will act like a bee sting.    Spider Bite Spider bites are not common. When spider bites do happen, most do not cause serious health problems. There are only a few types of spider bites that can cause serious health problems. What are the causes? A spider bite usually happens when a person accidentally makes contact with a spider in a way that traps the spider against the person's skin. What are the signs or symptoms? Symptoms may vary depending on the type of spider. Some spider bites may cause symptoms within 1 hour after the bite. For other spider bites, it may take 1-2 days for symptoms to develop. Common symptoms include:  Redness and swelling in the area of the bite.  Discomfort or pain in the area of the bite.  A few types of spiders, such as the black widow spider or the brown recluse spider, can inject poison (venom) into a bite wound. This venom causes more serious symptoms. Symptoms of a venomous spider bite vary, and may include:  Muscle cramps.  Nausea, vomiting, or abdominal pain.  Fever.  A skin sore (lesion) that spreads. This can break into an open wound (skin ulcer).  Light-headedness or dizziness.  How is this diagnosed? This condition may be diagnosed based on your symptoms and a physical exam. Your health care provider will ask about the history of your injury and any details you may have about the spider. This may help to determine what type of spider it was that bit you. How is this treated? Many spider bites do not require treatment. If needed, treatment may include:  Icing and keeping the bite area raised (elevated).  Over-the-counter or prescription medicines to help control symptoms.  A tetanus shot.  Antibiotic medicines.  Follow these instructions at home: Medicines  Take or apply over-the-counter and prescription  medicines only as told by your health care provider.  If you were prescribed an antibiotic medicine, take or apply it as told by your health care provider. Do not stop using the antibiotic even if your condition improves. General instructions  Do not scratch the bite area.  Keep the bite area clean and dry. Wash the bite area daily with soap and water as told by your health care provider.  If directed, apply ice to the bite area. ? Put ice in a plastic bag. ? Place a towel between your skin and the bag. ? Leave the ice on for 20 minutes, 2-3 times per day.  Elevate the affected area above the level of your heart while you are sitting or lying down, if possible.  Keep all follow-up visits as told by your health care provider. This is important. Contact a health care provider if:  Your bite does not get better after 3 days of treatment.  Your bite turns black or purple.  You have increased redness, swelling, or pain at the site of the bite. Get help right away if:  You develop shortness of breath or chest pain.  You have fluid, blood, or pus coming from the bite area.  You have muscle cramps or painful muscle spasms.  You develop abdominal pain, nausea, or vomiting.  You feel unusually tired (fatigued) or sleepy. This information is not intended to replace advice given to you by your health care provider. Make sure you discuss any questions you have  with your health care provider. Document Released: 02/27/2004 Document Revised: 09/16/2015 Document Reviewed: 06/06/2014 Elsevier Interactive Patient Education  [employer].

## 2017-09-25 ENCOUNTER — Other Ambulatory Visit: Payer: Self-pay | Admitting: Sports Medicine

## 2017-09-25 DIAGNOSIS — F321 Major depressive disorder, single episode, moderate: Secondary | ICD-10-CM

## 2017-10-10 ENCOUNTER — Other Ambulatory Visit: Payer: Self-pay | Admitting: Sports Medicine

## 2017-10-10 DIAGNOSIS — F321 Major depressive disorder, single episode, moderate: Secondary | ICD-10-CM

## 2017-10-11 ENCOUNTER — Other Ambulatory Visit: Payer: Self-pay | Admitting: Sports Medicine

## 2017-10-13 ENCOUNTER — Encounter: Payer: Self-pay | Admitting: Sports Medicine

## 2017-10-13 ENCOUNTER — Ambulatory Visit (INDEPENDENT_AMBULATORY_CARE_PROVIDER_SITE_OTHER): Payer: BLUE CROSS/BLUE SHIELD | Admitting: Sports Medicine

## 2017-10-13 VITALS — BP 135/69 | HR 67 | Ht 73.0 in | Wt 168.0 lb

## 2017-10-13 DIAGNOSIS — E7800 Pure hypercholesterolemia, unspecified: Secondary | ICD-10-CM | POA: Diagnosis not present

## 2017-10-13 DIAGNOSIS — G9332 Myalgic encephalomyelitis/chronic fatigue syndrome: Secondary | ICD-10-CM | POA: Insufficient documentation

## 2017-10-13 DIAGNOSIS — L989 Disorder of the skin and subcutaneous tissue, unspecified: Secondary | ICD-10-CM

## 2017-10-13 DIAGNOSIS — F5101 Primary insomnia: Secondary | ICD-10-CM

## 2017-10-13 DIAGNOSIS — R5383 Other fatigue: Secondary | ICD-10-CM

## 2017-10-13 DIAGNOSIS — Z23 Encounter for immunization: Secondary | ICD-10-CM

## 2017-10-13 DIAGNOSIS — R5382 Chronic fatigue, unspecified: Secondary | ICD-10-CM | POA: Insufficient documentation

## 2017-10-13 MED ORDER — TRAZODONE HCL 50 MG PO TABS: 50.0000 mg | ORAL_TABLET | Freq: Every day | ORAL | 1 refills | Status: DC

## 2017-10-13 MED ORDER — DOXYCYCLINE HYCLATE 100 MG PO TABS: 100.0000 mg | ORAL_TABLET | Freq: Two times a day (BID) | ORAL | 0 refills | Status: AC

## 2017-10-13 NOTE — Assessment & Plan Note (Signed)
Self discontinued his Lexapro. Mildly increasing depressive symptoms. I do think his fatigue is related to depression, we are going to check a serum work-up, CBC, CMP, TSH, testosterone levels. If these are all normal we will restart antidepressants.

## 2017-10-13 NOTE — Progress Notes (Signed)
Subjective:    CC: Several issues  HPI: Hyperlipidemia: Due to recheck.  Fatigue: Nonspecific, he did stop his antidepressant.  Insomnia: Has been taking a 12.5 continuous release Ambien as well as an additional half of a 5 mg Ambien.  We did discuss that this was an overdose.  Skin lesion: Left inner arm, slightly painful.  Present for a couple of weeks.  I reviewed the past medical history, family history, social history, surgical history, and allergies today and no changes were needed.  Please see the problem list section below in epic for further details.  Past Medical History: Past Medical History:  Diagnosis Date  . Depression, major, single episode, moderate ([place]) 11/24/2016   11/24/2016 PHQ9 = 21, GAD7 = 12 12/22/2016 PHQ 9 = 16, GAD 7 = 11 01/19/2017 PHQ 9 = 14, GAD 7 = 6 02/18/2017 PHQ 9 = 9, GAD 7 = 2  . Enteritis due to Clostridium difficile 11/09/2013  . GERD (gastroesophageal reflux disease) 03/09/2017  . Hyperlipidemia    Past Surgical History: Past Surgical History:  Procedure Laterality Date  . broken leg    . PROSTATECTOMY     Social History: Social History   Socioeconomic History  . Marital status: Married    Spouse name: Not on file  . Number of children: Not on file  . Years of education: Not on file  . Highest education level: Not on file  Occupational History  . Not on file  Social Needs  . Financial resource strain: Not on file  . Food insecurity:    Worry: Not on file    Inability: Not on file  . Transportation needs:    Medical: Not on file    Non-medical: Not on file  Tobacco Use  . Smoking status: Former Research scientist (life sciences)  . Smokeless tobacco: Never Used  Substance and Sexual Activity  . Alcohol use: No  . Drug use: Not on file  . Sexual activity: Not on file  Lifestyle  . Physical activity:    Days per week: Not on file    Minutes per session: Not on file  . Stress: Not on file  Relationships  . Social connections:    Talks on phone: Not  on file    Gets together: Not on file    Attends religious service: Not on file    Active member of club or organization: Not on file    Attends meetings of clubs or organizations: Not on file    Relationship status: Not on file  Other Topics Concern  . Not on file  Social History Narrative  . Not on file   Family History: Family History  Problem Relation Age of Onset  . Alcohol abuse Mother   . Cancer Mother   . Depression Mother   . Alcohol abuse Father   . Cancer Maternal Aunt   . Alcohol abuse Maternal Uncle   . Cancer Maternal Uncle   . Diabetes Maternal Grandmother    Allergies: Allergies  Allergen Reactions  . Imodium A-D [Loperamide Hcl] Swelling    Causes throat swelling    Medications: See med rec.  Review of Systems: No fevers, chills, night sweats, weight loss, chest pain, or shortness of breath.   Objective:    General: Well Developed, well nourished, and in no acute distress.  Neuro: Alert and oriented x3, extra-ocular muscles intact, sensation grossly intact.  HEENT: Normocephalic, atraumatic, pupils equal round reactive to light, neck supple, no masses, no lymphadenopathy, thyroid nonpalpable.  Skin: Warm and dry, no rashes.  Dome-shaped lesion on the left inner forearm. Cardiac: Regular rate and rhythm, no murmurs rubs or gallops, no lower extremity edema.  Respiratory: Clear to auscultation bilaterally. Not using accessory muscles, speaking in full sentences.  Impression and Recommendations:    Fatigue Self discontinued his Lexapro. Mildly increasing depressive symptoms. I do think his fatigue is related to depression, we are going to check a serum work-up, CBC, CMP, TSH, testosterone levels. If these are all normal we will restart antidepressants.  Hyperlipidemia Fasting today, checking lipid panel here  Insomnia Patient is taking Ambien 12.5 mg and a half of a 5 mg. Explained that this was an overdose. Discontinue the 5 mg Ambien, we will  just do continuous release Ambien and add trazodone 50 at bedtime. I will up taper the trazodone as needed. This has worsened slightly since he self discontinued his Lexapro.  Skin lesion of left arm Doxycycline for a week, return in 2 weeks, excision if no better. ___________________________________________ Gwen Her. Dianah Field, M.D., ABFM., CAQSM. Primary Care and [place] Instructor of [facility]

## 2017-10-13 NOTE — Assessment & Plan Note (Signed)
Doxycycline for a week, return in 2 weeks, excision if no better.

## 2017-10-13 NOTE — Assessment & Plan Note (Signed)
Patient is taking Ambien 12.5 mg and a half of a 5 mg. Explained that this was an overdose. Discontinue the 5 mg Ambien, we will just do continuous release Ambien and add trazodone 50 at bedtime. I will up taper the trazodone as needed. This has worsened slightly since he self discontinued his Lexapro.

## 2017-10-13 NOTE — Assessment & Plan Note (Signed)
Fasting today, checking lipid panel here

## 2017-10-17 LAB — COMPREHENSIVE METABOLIC PANEL WITH GFR
Alkaline phosphatase (APISO): 72 U/L (ref 40–115)
Chloride: 107 mmol/L (ref 98–110)
Creat: 0.82 mg/dL (ref 0.70–1.18)
Globulin: 2 g/dL (ref 1.9–3.7)
Glucose, Bld: 94 mg/dL (ref 65–99)
Potassium: 4.4 mmol/L (ref 3.5–5.3)
Sodium: 141 mmol/L (ref 135–146)
Total Protein: 6 g/dL — ABNORMAL LOW (ref 6.1–8.1)

## 2017-10-17 LAB — COMPREHENSIVE METABOLIC PANEL
AG Ratio: 2 (calc) (ref 1.0–2.5)
ALT: 9 U/L (ref 9–46)
AST: 19 U/L (ref 10–35)
Albumin: 4 g/dL (ref 3.6–5.1)
BUN: 15 mg/dL (ref 7–25)
CO2: 28 mmol/L (ref 20–32)
Calcium: 8.9 mg/dL (ref 8.6–10.3)
Total Bilirubin: 0.5 mg/dL (ref 0.2–1.2)

## 2017-10-17 LAB — LIPID PANEL W/REFLEX DIRECT LDL
Cholesterol: 139 mg/dL (ref <200)
HDL: 61 mg/dL (ref >40)
LDL Cholesterol (Calc): 64 mg/dL
Non-HDL Cholesterol (Calc): 78 mg/dL (ref <130)
Total CHOL/HDL Ratio: 2.3 (calc) (ref <5.0)
Triglycerides: 65 mg/dL (ref <150)

## 2017-10-17 LAB — CBC
HCT: 38.2 % — ABNORMAL LOW (ref 38.5–50.0)
Hemoglobin: 13.3 g/dL (ref 13.2–17.1)
MCH: 35.3 pg — ABNORMAL HIGH (ref 27.0–33.0)
MCHC: 34.8 g/dL (ref 32.0–36.0)
MCV: 101.3 fL — ABNORMAL HIGH (ref 80.0–100.0)
MPV: 10.3 fL (ref 7.5–12.5)
Platelets: 220 Thousand/uL (ref 140–400)
RBC: 3.77 Million/uL — ABNORMAL LOW (ref 4.20–5.80)
RDW: 15 % (ref 11.0–15.0)
WBC: 3.1 Thousand/uL — ABNORMAL LOW (ref 3.8–10.8)

## 2017-10-17 LAB — TESTOSTERONE, FREE & TOTAL
Free Testosterone: 59.4 pg/mL (ref 30.0–135.0)
Testosterone, Total, LC-MS-MS: 874 ng/dL (ref 250–1100)

## 2017-10-17 LAB — TSH: TSH: 2.32 m[IU]/L (ref 0.40–4.50)

## 2017-10-28 ENCOUNTER — Ambulatory Visit (INDEPENDENT_AMBULATORY_CARE_PROVIDER_SITE_OTHER): Payer: BLUE CROSS/BLUE SHIELD | Admitting: Sports Medicine

## 2017-10-28 ENCOUNTER — Encounter: Payer: Self-pay | Admitting: Sports Medicine

## 2017-10-28 DIAGNOSIS — F321 Major depressive disorder, single episode, moderate: Secondary | ICD-10-CM

## 2017-10-28 DIAGNOSIS — R5383 Other fatigue: Secondary | ICD-10-CM | POA: Diagnosis not present

## 2017-10-28 DIAGNOSIS — F5101 Primary insomnia: Secondary | ICD-10-CM

## 2017-10-28 MED ORDER — TRAZODONE HCL 100 MG PO TABS: 100.0000 mg | ORAL_TABLET | Freq: Every day | ORAL | 3 refills | Status: DC

## 2017-10-28 NOTE — Progress Notes (Signed)
Subjective:    CC: Follow-up  HPI: Dayan returns, he had some vague symptoms, difficulty concentrating, poor sleep, poor mood, fatigue.  We did a fairly comprehensive blood panel, everything was normal.  He does have a history of depression and I told him at the last visit that this was the likely culprit.  Significant difficulty sleeping was 1 of his major symptoms so we added trazodone and he is sleeping a bit better, does wake up at around 5 AM and would like to sleep a little bit longer, only taking 50 mg.  No suicidal or homicidal ideation.  I reviewed the past medical history, family history, social history, surgical history, and allergies today and no changes were needed.  Please see the problem list section below in epic for further details.  Past Medical History: Past Medical History:  Diagnosis Date  . Depression, major, single episode, moderate ([place]) 11/24/2016   11/24/2016 PHQ9 = 21, GAD7 = 12 12/22/2016 PHQ 9 = 16, GAD 7 = 11 01/19/2017 PHQ 9 = 14, GAD 7 = 6 02/18/2017 PHQ 9 = 9, GAD 7 = 2  . Enteritis due to Clostridium difficile 11/09/2013  . GERD (gastroesophageal reflux disease) 03/09/2017  . Hyperlipidemia    Past Surgical History: Past Surgical History:  Procedure Laterality Date  . broken leg    . PROSTATECTOMY     Social History: Social History   Socioeconomic History  . Marital status: Married    Spouse name: Not on file  . Number of children: Not on file  . Years of education: Not on file  . Highest education level: Not on file  Occupational History  . Not on file  Social Needs  . Financial resource strain: Not on file  . Food insecurity:    Worry: Not on file    Inability: Not on file  . Transportation needs:    Medical: Not on file    Non-medical: Not on file  Tobacco Use  . Smoking status: Former Research scientist (life sciences)  . Smokeless tobacco: Never Used  Substance and Sexual Activity  . Alcohol use: No  . Drug use: Not on file  . Sexual activity: Not on file    Lifestyle  . Physical activity:    Days per week: Not on file    Minutes per session: Not on file  . Stress: Not on file  Relationships  . Social connections:    Talks on phone: Not on file    Gets together: Not on file    Attends religious service: Not on file    Active member of club or organization: Not on file    Attends meetings of clubs or organizations: Not on file    Relationship status: Not on file  Other Topics Concern  . Not on file  Social History Narrative  . Not on file   Family History: Family History  Problem Relation Age of Onset  . Alcohol abuse Mother   . Cancer Mother   . Depression Mother   . Alcohol abuse Father   . Cancer Maternal Aunt   . Alcohol abuse Maternal Uncle   . Cancer Maternal Uncle   . Diabetes Maternal Grandmother    Allergies: Allergies  Allergen Reactions  . Imodium A-D [Loperamide Hcl] Swelling    Causes throat swelling    Medications: See med rec.  Review of Systems: No fevers, chills, night sweats, weight loss, chest pain, or shortness of breath.   Objective:    General: Well Developed,  well nourished, and in no acute distress.  Neuro: Alert and oriented x3, extra-ocular muscles intact, sensation grossly intact.  HEENT: Normocephalic, atraumatic, pupils equal round reactive to light, neck supple, no masses, no lymphadenopathy, thyroid nonpalpable.  Skin: Warm and dry, no rashes. Cardiac: Regular rate and rhythm, no murmurs rubs or gallops, no lower extremity edema.  Respiratory: Clear to auscultation bilaterally. Not using accessory muscles, speaking in full sentences.  Impression and Recommendations:    Insomnia Increasing trazodone to 100 mg daily.  Fatigue All labs are normal, fatigue is from depression. I think as we titrate up his trazodone he will see some improvement, it is an atypical antidepressant.  Depression, major, single episode, moderate ([facility]) All labs are normal, fatigue is from depression. I think  as we titrate up his trazodone he will see some improvement, it is an atypical antidepressant.  I spent 25 minutes with this patient, greater than 50% was face-to-face time counseling regarding the above diagnoses, specifically we discussed the diagnosis and treatment options as well as the heterogeneity of symptoms of depression. ___________________________________________ Gwen Her. Dianah Field, M.D., ABFM., CAQSM. Primary Care and [place] Instructor of [place] [facility]

## 2017-10-28 NOTE — Assessment & Plan Note (Signed)
All labs are normal, fatigue is from depression. I think as we titrate up his trazodone he will see some improvement, it is an atypical antidepressant.

## 2017-10-28 NOTE — Assessment & Plan Note (Signed)
Increasing trazodone to 100 mg daily.

## 2017-11-01 ENCOUNTER — Ambulatory Visit (INDEPENDENT_AMBULATORY_CARE_PROVIDER_SITE_OTHER): Payer: BLUE CROSS/BLUE SHIELD | Admitting: Sports Medicine

## 2017-11-01 ENCOUNTER — Encounter: Payer: Self-pay | Admitting: Sports Medicine

## 2017-11-01 VITALS — BP 125/64 | HR 72

## 2017-11-01 DIAGNOSIS — L989 Disorder of the skin and subcutaneous tissue, unspecified: Secondary | ICD-10-CM | POA: Diagnosis not present

## 2017-11-01 DIAGNOSIS — W5501XA Bitten by cat, initial encounter: Secondary | ICD-10-CM | POA: Diagnosis not present

## 2017-11-01 DIAGNOSIS — L539 Erythematous condition, unspecified: Secondary | ICD-10-CM | POA: Diagnosis not present

## 2017-11-01 MED ORDER — DOXYCYCLINE HYCLATE 100 MG PO TABS: 100.0000 mg | ORAL_TABLET | Freq: Two times a day (BID) | ORAL | 0 refills | Status: DC

## 2017-11-01 NOTE — Progress Notes (Signed)
Subjective:    CC: Skin surgery, cat bite  HPI: Christian Lambert was recently bitten by his cat, left dorsal forearm.  There is a bit of surrounding erythema.  Minimal pain.  Localized without radiation.  In addition he is here for excision of a suspicious lesion on his volar left forearm.  I reviewed the past medical history, family history, social history, surgical history, and allergies today and no changes were needed.  Please see the problem list section below in epic for further details.  Past Medical History: Past Medical History:  Diagnosis Date  . Depression, major, single episode, moderate ([place]) 11/24/2016   11/24/2016 PHQ9 = 21, GAD7 = 12 12/22/2016 PHQ 9 = 16, GAD 7 = 11 01/19/2017 PHQ 9 = 14, GAD 7 = 6 02/18/2017 PHQ 9 = 9, GAD 7 = 2  . Enteritis due to Clostridium difficile 11/09/2013  . GERD (gastroesophageal reflux disease) 03/09/2017  . Hyperlipidemia    Past Surgical History: Past Surgical History:  Procedure Laterality Date  . broken leg    . PROSTATECTOMY     Social History: Social History   Socioeconomic History  . Marital status: Married    Spouse name: Not on file  . Number of children: Not on file  . Years of education: Not on file  . Highest education level: Not on file  Occupational History  . Not on file  Social Needs  . Financial resource strain: Not on file  . Food insecurity:    Worry: Not on file    Inability: Not on file  . Transportation needs:    Medical: Not on file    Non-medical: Not on file  Tobacco Use  . Smoking status: Former Research scientist (life sciences)  . Smokeless tobacco: Never Used  Substance and Sexual Activity  . Alcohol use: No  . Drug use: Not on file  . Sexual activity: Not on file  Lifestyle  . Physical activity:    Days per week: Not on file    Minutes per session: Not on file  . Stress: Not on file  Relationships  . Social connections:    Talks on phone: Not on file    Gets together: Not on file    Attends religious service: Not on file      Active member of club or organization: Not on file    Attends meetings of clubs or organizations: Not on file    Relationship status: Not on file  Other Topics Concern  . Not on file  Social History Narrative  . Not on file   Family History: Family History  Problem Relation Age of Onset  . Alcohol abuse Mother   . Cancer Mother   . Depression Mother   . Alcohol abuse Father   . Cancer Maternal Aunt   . Alcohol abuse Maternal Uncle   . Cancer Maternal Uncle   . Diabetes Maternal Grandmother    Allergies: Allergies  Allergen Reactions  . Imodium A-D [Loperamide Hcl] Swelling    Causes throat swelling    Medications: See med rec.  Review of Systems: No fevers, chills, night sweats, weight loss, chest pain, or shortness of breath.   Objective:    General: Well Developed, well nourished, and in no acute distress.  Neuro: Alert and oriented x3, extra-ocular muscles intact, sensation grossly intact.  HEENT: Normocephalic, atraumatic, pupils equal round reactive to light, neck supple, no masses, no lymphadenopathy, thyroid nonpalpable.  Skin: Warm and dry, no rashes. Cardiac: Regular rate and rhythm,  no murmurs rubs or gallops, no lower extremity edema.  Respiratory: Clear to auscultation bilaterally. Not using accessory muscles, speaking in full sentences. Left forearm: There is a tear in the skin with a bit of surrounding erythema on the dorsal forearm.  He also has a dome-shaped lesion on his volar forearm, 2.1 cm  Procedure:  Excision of 2.1 cm with a left volar forearm suspicious skin lesion Risks, benefits, and alternatives explained and consent obtained. Time out conducted. Surface prepped with alcohol. 5cc lidocaine with epinephine infiltrated in a field block. Adequate anesthesia ensured. Area prepped and draped in a sterile fashion. Excision performed with: Using a #15 blade I made an elliptical incision, then using both sharp and blunt dissection I carried the  procedure into the subcutaneous tissues, pushing aside and preserving subcutaneous veins.  I removed the lesion en-bloc and closed the incision with four 4-0 simple interrupted Ethilon sutures. Hemostasis achieved. Pt stable.  Impression and Recommendations:    Skin lesion of left arm Excision of volar forearm skin lesion. Sutures placed, return in 7-10 days for suture removal.  Cat bite Left dorsal forearm. Though Pasteurella is historically linked with cat bites Staphylococcus aureus infection is most likely. I do think there is a bit of cellulitis, adding doxycycline for 7 days. ___________________________________________ Gwen Her. Dianah Field, M.D., ABFM., CAQSM. Primary Care and [place] Instructor of [place] [facility]

## 2017-11-01 NOTE — Assessment & Plan Note (Signed)
Left dorsal forearm. Though Pasteurella is historically linked with cat bites Staphylococcus aureus infection is most likely. I do think there is a bit of cellulitis, adding doxycycline for 7 days.

## 2017-11-01 NOTE — Assessment & Plan Note (Signed)
Excision of volar forearm skin lesion. Sutures placed, return in 7-10 days for suture removal.

## 2017-11-06 ENCOUNTER — Other Ambulatory Visit: Payer: Self-pay | Admitting: Sports Medicine

## 2017-11-06 DIAGNOSIS — K219 Gastro-esophageal reflux disease without esophagitis: Secondary | ICD-10-CM

## 2017-11-08 ENCOUNTER — Ambulatory Visit (INDEPENDENT_AMBULATORY_CARE_PROVIDER_SITE_OTHER): Payer: BLUE CROSS/BLUE SHIELD | Admitting: Sports Medicine

## 2017-11-08 ENCOUNTER — Encounter: Payer: Self-pay | Admitting: Sports Medicine

## 2017-11-08 DIAGNOSIS — L989 Disorder of the skin and subcutaneous tissue, unspecified: Secondary | ICD-10-CM

## 2017-11-08 NOTE — Progress Notes (Signed)
  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  Objective: General: Well-developed, well-nourished, and in no acute distress. Left forearm: Some bruising, incision is clean, dry, intact, sutures removed.  Dermabond applied to reinforce incision edges.  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 Sutures removed today, Dermabond applied. Return as needed. ___________________________________________ Gwen Her. Dianah Field, M.D., ABFM., CAQSM. Primary Care and [place] Instructor of [place] of [facility] Medicine

## 2017-11-08 NOTE — Assessment & Plan Note (Signed)
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 Sutures removed today, Dermabond applied. Return as needed.

## 2017-11-25 ENCOUNTER — Ambulatory Visit (INDEPENDENT_AMBULATORY_CARE_PROVIDER_SITE_OTHER): Payer: BLUE CROSS/BLUE SHIELD | Admitting: Sports Medicine

## 2017-11-25 DIAGNOSIS — L989 Disorder of the skin and subcutaneous tissue, unspecified: Secondary | ICD-10-CM | POA: Diagnosis not present

## 2017-11-25 DIAGNOSIS — W5501XD Bitten by cat, subsequent encounter: Secondary | ICD-10-CM | POA: Diagnosis not present

## 2017-11-25 MED ORDER — TRIAMCINOLONE ACETONIDE 0.1 % EX OINT: 1.0000 "application " | TOPICAL_OINTMENT | Freq: Two times a day (BID) | CUTANEOUS | 6 refills | Status: DC

## 2017-11-25 NOTE — Progress Notes (Signed)
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  He has since developed a skin tear where he bumped into a dorsal, and has a couple of other lesions that are pruritic on his left upper arm.  Symptoms are moderate, persistent.  I reviewed the past medical history, family history, social history, surgical history, and allergies today and no changes were needed.  Please see the problem list section below in epic for further details.  Past Medical History: Past Medical History:  Diagnosis Date  . Depression, major, single episode, moderate ([place]) 11/24/2016   11/24/2016 PHQ9 = 21, GAD7 = 12 12/22/2016 PHQ 9 = 16, GAD 7 = 11 01/19/2017 PHQ 9 = 14, GAD 7 = 6 02/18/2017 PHQ 9 = 9, GAD 7 = 2  . Enteritis due to Clostridium difficile 11/09/2013  . GERD (gastroesophageal reflux disease) 03/09/2017  . Hyperlipidemia    Past Surgical History: Past Surgical History:  Procedure Laterality Date  . broken leg    . PROSTATECTOMY     Social History: Social History   Socioeconomic History  . Marital status: Married    Spouse name: Not on file  . Number of children: Not on file  . Years of education: Not on file  . Highest education level: Not on file  Occupational History  . Not on file  Social Needs  . Financial resource strain: Not on file  . Food insecurity:    Worry: Not on file    Inability: Not on file  . Transportation needs:    Medical: Not on file    Non-medical: Not on file  Tobacco Use  . Smoking status: Former Research scientist (life sciences)  . Smokeless tobacco: Never Used  Substance and Sexual Activity  . Alcohol use: No  . Drug use: Not on file  . Sexual activity: Not on file  Lifestyle  . Physical activity:    Days per week: Not on file    Minutes per session: Not on file  . Stress: Not on file  Relationships  . Social  connections:    Talks on phone: Not on file    Gets together: Not on file    Attends religious service: Not on file    Active member of club or organization: Not on file    Attends meetings of clubs or organizations: Not on file    Relationship status: Not on file  Other Topics Concern  . Not on file  Social History Narrative  . Not on file   Family History: Family History  Problem Relation Age of Onset  . Alcohol abuse Mother   . Cancer Mother   . Depression Mother   . Alcohol abuse Father   . Cancer Maternal Aunt   . Alcohol abuse Maternal Uncle   . Cancer Maternal Uncle   . Diabetes Maternal Grandmother    Allergies: Allergies  Allergen Reactions  . Imodium A-D [Loperamide Hcl] Swelling    Causes throat swelling    Medications: See med rec.  Review of Systems: No fevers, chills, night sweats, weight loss, chest pain, or shortness of breath.   Objective:    General: Well Developed, well nourished, and in no acute distress.  Neuro: Alert and oriented x3, extra-ocular muscles intact, sensation grossly intact.  HEENT: Normocephalic, atraumatic, pupils equal round reactive to light, neck supple,  no masses, no lymphadenopathy, thyroid nonpalpable.  Skin: Warm and dry, 2 cm skin tear on the left forearm.  Excoriated papular lesions on the forearm and upper arm on the left. Cardiac: Regular rate and rhythm, no murmurs rubs or gallops, no lower extremity edema.  Respiratory: Clear to auscultation bilaterally. Not using accessory muscles, speaking in full sentences.  Laceration repair: Indication: bleeding Location: Left forearm Size: 2 cm Anesthesia: None needed Wound explored, irrigated, FB removed (if present) Steri-Strips and Dermabond applied Tolerated well Routine postprocedure instructions d/w pt- keep area clean and bandaged, follow up if concerns/spreading erythema/pain.    Impression and Recommendations:    Skin lesion of left arm Previous excision site of  the squamous cell carcinoma with clear margins is well-healed. Small skin tear was closed with Dermabond. 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 Adding topical triamcinolone cream.  Cat bite The previous site of the cat bite is healed but he does have a nodule subcutaneous. Adding topical triamcinolone, if persistent symptoms at the 1 month point we will do a surgical excision. ___________________________________________ Gwen Her. Dianah Field, M.D., ABFM., CAQSM. Primary Care and Sports Medicine [facility]  Adjunct Professor of [place] of [facility] of Medicine

## 2017-11-25 NOTE — Assessment & Plan Note (Signed)
The previous site of the cat bite is healed but he does have a nodule subcutaneous. Adding topical triamcinolone, if persistent symptoms at the 1 month point we will do a surgical excision.

## 2017-11-25 NOTE — Assessment & Plan Note (Signed)
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 Small skin tear was closed with Dermabond. 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 Adding topical triamcinolone cream.

## 2017-11-30 ENCOUNTER — Other Ambulatory Visit: Payer: Self-pay | Admitting: Sports Medicine

## 2017-11-30 DIAGNOSIS — N139 Obstructive and reflux uropathy, unspecified: Secondary | ICD-10-CM

## 2017-12-04 ENCOUNTER — Other Ambulatory Visit: Payer: Self-pay | Admitting: Sports Medicine

## 2017-12-04 DIAGNOSIS — F321 Major depressive disorder, single episode, moderate: Secondary | ICD-10-CM

## 2017-12-23 ENCOUNTER — Ambulatory Visit (INDEPENDENT_AMBULATORY_CARE_PROVIDER_SITE_OTHER): Payer: BLUE CROSS/BLUE SHIELD | Admitting: Sports Medicine

## 2017-12-23 ENCOUNTER — Encounter: Payer: Self-pay | Admitting: Sports Medicine

## 2017-12-23 DIAGNOSIS — F321 Major depressive disorder, single episode, moderate: Secondary | ICD-10-CM

## 2017-12-23 DIAGNOSIS — W5501XD Bitten by cat, subsequent encounter: Secondary | ICD-10-CM | POA: Diagnosis not present

## 2017-12-23 MED ORDER — ZOLPIDEM TARTRATE ER 12.5 MG PO TBCR: 12.5000 mg | EXTENDED_RELEASE_TABLET | Freq: Every evening | ORAL | 1 refills | Status: DC | PRN

## 2017-12-23 NOTE — Patient Instructions (Signed)
We we can just watch the nodules for now, if it evolves or enlarges or ulcerates the skin I am happy to do an excisional biopsy.

## 2017-12-23 NOTE — Progress Notes (Signed)
Subjective:    CC: Follow-up  HPI: Neurodermatitis: For the most part resolving.  He has a couple of nodules on his forearm, one is within the scar from the previous excisional biopsy, the other one is where his skin was torn from a cat scratch and bite.  Insomnia: Needs a new prescription for zolpidem, I printed out and gave it to him.  I reviewed the past medical history, family history, social history, surgical history, and allergies today and no changes were needed.  Please see the problem list section below in epic for further details.  Past Medical History: Past Medical History:  Diagnosis Date  . Depression, major, single episode, moderate ([place]) 11/24/2016   11/24/2016 PHQ9 = 21, GAD7 = 12 12/22/2016 PHQ 9 = 16, GAD 7 = 11 01/19/2017 PHQ 9 = 14, GAD 7 = 6 02/18/2017 PHQ 9 = 9, GAD 7 = 2  . Enteritis due to Clostridium difficile 11/09/2013  . GERD (gastroesophageal reflux disease) 03/09/2017  . Hyperlipidemia    Past Surgical History: Past Surgical History:  Procedure Laterality Date  . broken leg    . PROSTATECTOMY     Social History: Social History   Socioeconomic History  . Marital status: Married    Spouse name: Not on file  . Number of children: Not on file  . Years of education: Not on file  . Highest education level: Not on file  Occupational History  . Not on file  Social Needs  . Financial resource strain: Not on file  . Food insecurity:    Worry: Not on file    Inability: Not on file  . Transportation needs:    Medical: Not on file    Non-medical: Not on file  Tobacco Use  . Smoking status: Former Research scientist (life sciences)  . Smokeless tobacco: Never Used  Substance and Sexual Activity  . Alcohol use: No  . Drug use: Not on file  . Sexual activity: Not on file  Lifestyle  . Physical activity:    Days per week: Not on file    Minutes per session: Not on file  . Stress: Not on file  Relationships  . Social connections:    Talks on phone: Not on file    Gets  together: Not on file    Attends religious service: Not on file    Active member of club or organization: Not on file    Attends meetings of clubs or organizations: Not on file    Relationship status: Not on file  Other Topics Concern  . Not on file  Social History Narrative  . Not on file   Family History: Family History  Problem Relation Age of Onset  . Alcohol abuse Mother   . Cancer Mother   . Depression Mother   . Alcohol abuse Father   . Cancer Maternal Aunt   . Alcohol abuse Maternal Uncle   . Cancer Maternal Uncle   . Diabetes Maternal Grandmother    Allergies: Allergies  Allergen Reactions  . Imodium A-D [Loperamide Hcl] Swelling    Causes throat swelling    Medications: See med rec.  Review of Systems: No fevers, chills, night sweats, weight loss, chest pain, or shortness of breath.   Objective:    General: Well Developed, well nourished, and in no acute distress.  Neuro: Alert and oriented x3, extra-ocular muscles intact, sensation grossly intact.  HEENT: Normocephalic, atraumatic, pupils equal round reactive to light, neck supple, no masses, no lymphadenopathy, thyroid nonpalpable.  Skin: Warm and dry, no rashes. Cardiac: Regular rate and rhythm, no murmurs rubs or gallops, no lower extremity edema.  Respiratory: Clear to auscultation bilaterally. Not using accessory muscles, speaking in full sentences. Forearm: Incision is well-healed, there is a hint of a scar palpable, he does have a couple of other well-defined sub-cutaneous nodules at sites of prior scars, no skin ulcerations, scaliness, friability.  Impression and Recommendations:    Cat bite Healing well, he has a nodule subcutaneous, likely scar. We we can just watch the nodules for now, if it evolves or enlarges or ulcerates the skin I am happy to do an excisional biopsy. ___________________________________________ Gwen Her. Dianah Field, M.D., ABFM., CAQSM. Primary Care and Sports Medicine [facility]  Adjunct Professor of [place] of [facility] of Medicine

## 2017-12-23 NOTE — Assessment & Plan Note (Signed)
Healing well, he has a nodule subcutaneous, likely scar. We we can just watch the nodules for now, if it evolves or enlarges or ulcerates the skin I am happy to do an excisional biopsy.

## 2017-12-29 ENCOUNTER — Ambulatory Visit: Payer: BLUE CROSS/BLUE SHIELD | Admitting: Sports Medicine

## 2018-02-07 ENCOUNTER — Other Ambulatory Visit: Payer: Self-pay | Admitting: Sports Medicine

## 2018-02-07 DIAGNOSIS — F321 Major depressive disorder, single episode, moderate: Secondary | ICD-10-CM

## 2018-02-08 ENCOUNTER — Other Ambulatory Visit: Payer: Self-pay | Admitting: Sports Medicine

## 2018-02-08 DIAGNOSIS — F5101 Primary insomnia: Secondary | ICD-10-CM

## 2018-02-17 ENCOUNTER — Other Ambulatory Visit: Payer: Self-pay | Admitting: Sports Medicine

## 2018-03-02 ENCOUNTER — Other Ambulatory Visit: Payer: Self-pay | Admitting: Sports Medicine

## 2018-03-02 DIAGNOSIS — F321 Major depressive disorder, single episode, moderate: Secondary | ICD-10-CM

## 2018-03-22 ENCOUNTER — Ambulatory Visit (INDEPENDENT_AMBULATORY_CARE_PROVIDER_SITE_OTHER): Payer: BLUE CROSS/BLUE SHIELD | Admitting: Sports Medicine

## 2018-03-22 ENCOUNTER — Encounter: Payer: Self-pay | Admitting: Sports Medicine

## 2018-03-22 DIAGNOSIS — R5383 Other fatigue: Secondary | ICD-10-CM

## 2018-03-22 DIAGNOSIS — R5382 Chronic fatigue, unspecified: Secondary | ICD-10-CM | POA: Diagnosis not present

## 2018-03-22 DIAGNOSIS — G9332 Myalgic encephalomyelitis/chronic fatigue syndrome: Secondary | ICD-10-CM

## 2018-03-22 MED ORDER — AMPHETAMINE-DEXTROAMPHETAMINE 10 MG PO TABS: 10.0000 mg | ORAL_TABLET | Freq: Two times a day (BID) | ORAL | 0 refills | Status: DC

## 2018-03-22 NOTE — Assessment & Plan Note (Addendum)
At this point he is labs are normal, testosterone levels are normal, his depression is now controlled with trazodone, he sleeps well with Ambien. He endorses difficulty putting on muscle, but on further questioning he has a full gym downstairs in his house he does not use it. He wanted to start testosterone supplementation, his son told him that he changed his life.  On further questioning his son's testosterone was actually low. This is likely chronic fatigue syndrome, adding low-dose Adderall.

## 2018-03-22 NOTE — Progress Notes (Signed)
Subjective:    CC: Discuss fatigue, other issues  HPI: Fatigue: At this point Christian Lambert's labs were normal, testosterone levels were normal, he sleeps well, his depression is controlled, he asked about starting testosterone supplementation.  Difficulty putting on muscle: He does have a full gym at his house but does not use it.  I reviewed the past medical history, family history, social history, surgical history, and allergies today and no changes were needed.  Please see the problem list section below in epic for further details.  Past Medical History: Past Medical History:  Diagnosis Date  . Depression, major, single episode, moderate ([place]) 11/24/2016   11/24/2016 PHQ9 = 21, GAD7 = 12 12/22/2016 PHQ 9 = 16, GAD 7 = 11 01/19/2017 PHQ 9 = 14, GAD 7 = 6 02/18/2017 PHQ 9 = 9, GAD 7 = 2  . Enteritis due to Clostridium difficile 11/09/2013  . GERD (gastroesophageal reflux disease) 03/09/2017  . Hyperlipidemia    Past Surgical History: Past Surgical History:  Procedure Laterality Date  . broken leg    . PROSTATECTOMY     Social History: Social History   Socioeconomic History  . Marital status: Married    Spouse name: Not on file  . Number of children: Not on file  . Years of education: Not on file  . Highest education level: Not on file  Occupational History  . Not on file  Social Needs  . Financial resource strain: Not on file  . Food insecurity:    Worry: Not on file    Inability: Not on file  . Transportation needs:    Medical: Not on file    Non-medical: Not on file  Tobacco Use  . Smoking status: Former Research scientist (life sciences)  . Smokeless tobacco: Never Used  Substance and Sexual Activity  . Alcohol use: No  . Drug use: Not on file  . Sexual activity: Not on file  Lifestyle  . Physical activity:    Days per week: Not on file    Minutes per session: Not on file  . Stress: Not on file  Relationships  . Social connections:    Talks on phone: Not on file    Gets together: Not on  file    Attends religious service: Not on file    Active member of club or organization: Not on file    Attends meetings of clubs or organizations: Not on file    Relationship status: Not on file  Other Topics Concern  . Not on file  Social History Narrative  . Not on file   Family History: Family History  Problem Relation Age of Onset  . Alcohol abuse Mother   . Cancer Mother   . Depression Mother   . Alcohol abuse Father   . Cancer Maternal Aunt   . Alcohol abuse Maternal Uncle   . Cancer Maternal Uncle   . Diabetes Maternal Grandmother    Allergies: Allergies  Allergen Reactions  . Imodium A-D [Loperamide Hcl] Swelling    Causes throat swelling    Medications: See med rec.  Review of Systems: No fevers, chills, night sweats, weight loss, chest pain, or shortness of breath.   Objective:    General: Well Developed, well nourished, and in no acute distress.  Neuro: Alert and oriented x3, extra-ocular muscles intact, sensation grossly intact.  HEENT: Normocephalic, atraumatic, pupils equal round reactive to light, neck supple, no masses, no lymphadenopathy, thyroid nonpalpable.  Skin: Warm and dry, no rashes. Cardiac: Regular rate and  rhythm, no murmurs rubs or gallops, no lower extremity edema.  Respiratory: Clear to auscultation bilaterally. Not using accessory muscles, speaking in full sentences.  Impression and Recommendations:    CFS (chronic fatigue syndrome) At this point he is labs are normal, testosterone levels are normal, his depression is now controlled with trazodone, he sleeps well with Ambien. He endorses difficulty putting on muscle, but on further questioning he has a full gym downstairs in his house he does not use it. He wanted to start testosterone supplementation, his son told him that he changed his life.  On further questioning his son's testosterone was actually low. This is likely chronic fatigue syndrome, adding low-dose Adderall.  I spent 25  minutes with this patient, greater than 50% was face-to-face time counseling regarding the above diagnoses, specifically specifics of chronic fatigue syndrome, and how it is more of a wastebasket diagnosis. ___________________________________________ Gwen Her. Dianah Field, M.D., ABFM., CAQSM. Primary Care and Sports Medicine [place] [facility]  Adjunct Professor of [place] of [facility] of Medicine

## 2018-04-16 ENCOUNTER — Other Ambulatory Visit: Payer: Self-pay | Admitting: Sports Medicine

## 2018-04-16 DIAGNOSIS — F5101 Primary insomnia: Secondary | ICD-10-CM

## 2018-04-19 ENCOUNTER — Ambulatory Visit: Payer: BLUE CROSS/BLUE SHIELD | Admitting: Sports Medicine

## 2018-05-02 ENCOUNTER — Other Ambulatory Visit: Payer: Self-pay | Admitting: Sports Medicine

## 2018-05-02 DIAGNOSIS — F321 Major depressive disorder, single episode, moderate: Secondary | ICD-10-CM

## 2018-05-06 ENCOUNTER — Other Ambulatory Visit: Payer: Self-pay | Admitting: Sports Medicine

## 2018-05-06 DIAGNOSIS — K219 Gastro-esophageal reflux disease without esophagitis: Secondary | ICD-10-CM

## 2018-06-05 ENCOUNTER — Other Ambulatory Visit: Payer: Self-pay | Admitting: Sports Medicine

## 2018-06-05 DIAGNOSIS — F321 Major depressive disorder, single episode, moderate: Secondary | ICD-10-CM

## 2018-07-03 ENCOUNTER — Other Ambulatory Visit: Payer: Self-pay | Admitting: Sports Medicine

## 2018-07-03 DIAGNOSIS — F321 Major depressive disorder, single episode, moderate: Secondary | ICD-10-CM

## 2018-08-24 ENCOUNTER — Encounter: Payer: Self-pay | Admitting: Sports Medicine

## 2018-08-24 ENCOUNTER — Other Ambulatory Visit: Payer: Self-pay

## 2018-08-24 ENCOUNTER — Ambulatory Visit (INDEPENDENT_AMBULATORY_CARE_PROVIDER_SITE_OTHER): Payer: BC Managed Care – PPO | Admitting: Sports Medicine

## 2018-08-24 DIAGNOSIS — L219 Seborrheic dermatitis, unspecified: Secondary | ICD-10-CM

## 2018-08-24 DIAGNOSIS — Z299 Encounter for prophylactic measures, unspecified: Secondary | ICD-10-CM

## 2018-08-24 DIAGNOSIS — L821 Other seborrheic keratosis: Secondary | ICD-10-CM | POA: Diagnosis not present

## 2018-08-24 DIAGNOSIS — J310 Chronic rhinitis: Secondary | ICD-10-CM | POA: Insufficient documentation

## 2018-08-24 DIAGNOSIS — J00 Acute nasopharyngitis [common cold]: Secondary | ICD-10-CM | POA: Diagnosis not present

## 2018-08-24 MED ORDER — CETIRIZINE HCL 10 MG PO TABS: 10.0000 mg | ORAL_TABLET | Freq: Every day | ORAL | 11 refills | Status: DC

## 2018-08-24 MED ORDER — SHINGRIX 50 MCG/0.5ML IM SUSR: 0.5000 mL | Freq: Once | INTRAMUSCULAR | 0 refills | Status: AC

## 2018-08-24 MED ORDER — FLUTICASONE PROPIONATE 50 MCG/ACT NA SUSP: NASAL | 3 refills | Status: DC

## 2018-08-24 MED ORDER — TRIAMCINOLONE ACETONIDE 0.1 % EX CREA: 1.0000 "application " | TOPICAL_CREAM | Freq: Two times a day (BID) | CUTANEOUS | 0 refills | Status: DC

## 2018-08-24 NOTE — Assessment & Plan Note (Signed)
With postnasal drip syndrome, adding Flonase, Zyrtec.

## 2018-08-24 NOTE — Assessment & Plan Note (Signed)
Sending off prescription for Shingrix 2 shot series to pharmacy.

## 2018-08-24 NOTE — Assessment & Plan Note (Signed)
Cryotherapy as above. 

## 2018-08-24 NOTE — Progress Notes (Signed)
Subjective:    CC: Several issues  HPI: Skin lesion: Face, chest, back.  Preventive measures: Needs shingles vaccination.  Runny nose: With postnasal drip, significant amounts of mucus production.  No sinus pain or pressure.  I reviewed the past medical history, family history, social history, surgical history, and allergies today and no changes were needed.  Please see the problem list section below in epic for further details.  Past Medical History: Past Medical History:  Diagnosis Date  . Depression, major, single episode, moderate ([place]) 11/24/2016   11/24/2016 PHQ9 = 21, GAD7 = 12 12/22/2016 PHQ 9 = 16, GAD 7 = 11 01/19/2017 PHQ 9 = 14, GAD 7 = 6 02/18/2017 PHQ 9 = 9, GAD 7 = 2  . Enteritis due to Clostridium difficile 11/09/2013  . GERD (gastroesophageal reflux disease) 03/09/2017  . Hyperlipidemia    Past Surgical History: Past Surgical History:  Procedure Laterality Date  . broken leg    . PROSTATECTOMY     Social History: Social History   Socioeconomic History  . Marital status: Married    Spouse name: Not on file  . Number of children: Not on file  . Years of education: Not on file  . Highest education level: Not on file  Occupational History  . Not on file  Social Needs  . Financial resource strain: Not on file  . Food insecurity    Worry: Not on file    Inability: Not on file  . Transportation needs    Medical: Not on file    Non-medical: Not on file  Tobacco Use  . Smoking status: Former Research scientist (life sciences)  . Smokeless tobacco: Never Used  Substance and Sexual Activity  . Alcohol use: No  . Drug use: Not on file  . Sexual activity: Not on file  Lifestyle  . Physical activity    Days per week: Not on file    Minutes per session: Not on file  . Stress: Not on file  Relationships  . Social Herbalist on phone: Not on file    Gets together: Not on file    Attends religious service: Not on file    Active member of club or organization: Not on file    Attends meetings of clubs or organizations: Not on file    Relationship status: Not on file  Other Topics Concern  . Not on file  Social History Narrative  . Not on file   Family History: Family History  Problem Relation Age of Onset  . Alcohol abuse Mother   . Cancer Mother   . Depression Mother   . Alcohol abuse Father   . Cancer Maternal Aunt   . Alcohol abuse Maternal Uncle   . Cancer Maternal Uncle   . Diabetes Maternal Grandmother    Allergies: Allergies  Allergen Reactions  . Imodium A-D [Loperamide Hcl] Swelling    Causes throat swelling    Medications: See med rec.  Review of Systems: No fevers, chills, night sweats, weight loss, chest pain, or shortness of breath.   Objective:    General: Well Developed, well nourished, and in no acute distress.  Neuro: Alert and oriented x3, extra-ocular muscles intact, sensation grossly intact.  HEENT: Normocephalic, atraumatic, pupils equal round reactive to light, neck supple, no masses, no lymphadenopathy, thyroid nonpalpable.  Skin: Warm and dry, scaly rash in the nasolabial folds, eyebrows, there is a circular scaly rough lesion just below his left eye over the zygoma.  He has  a seborrheic keratosis on his left upper chest. Cardiac: Regular rate and rhythm, no murmurs rubs or gallops, no lower extremity edema.  Respiratory: Clear to auscultation bilaterally. Not using accessory muscles, speaking in full sentences.  Procedure:  Cryodestruction of left upper chest seborrheic keratosis Consent obtained and verified. Time-out conducted. Noted no overlying erythema, induration, or other signs of local infection. Completed without difficulty using Cryo-Gun. Advised to call if fevers/chills, erythema, induration, drainage, or persistent bleeding.  Impression and Recommendations:    Seborrheic keratosis Cryotherapy as above  Seborrheic dermatitis Adding topical low potency triamcinolone. This worked back in 2017. He  does have rough lesion just below his orbit on the left, if this persists after 2 to 3 weeks of topical triamcinolone we will do a shave biopsy.  Rhinitis With postnasal drip syndrome, adding Flonase, Zyrtec.  Preventive measure Sending off prescription for Shingrix 2 shot series to pharmacy.   ___________________________________________ Gwen Her. Dianah Field, M.D., ABFM., CAQSM. Primary Care and Sports Medicine [place] [facility]  Adjunct Professor of [place] of [facility] of Medicine

## 2018-08-24 NOTE — Assessment & Plan Note (Signed)
Adding topical low potency triamcinolone. This worked back in 2017. He does have rough lesion just below his orbit on the left, if this persists after 2 to 3 weeks of topical triamcinolone we will do a shave biopsy.

## 2018-09-02 ENCOUNTER — Other Ambulatory Visit: Payer: Self-pay | Admitting: Sports Medicine

## 2018-09-02 DIAGNOSIS — F321 Major depressive disorder, single episode, moderate: Secondary | ICD-10-CM

## 2018-09-14 ENCOUNTER — Other Ambulatory Visit: Payer: Self-pay

## 2018-09-14 ENCOUNTER — Encounter: Payer: Self-pay | Admitting: Sports Medicine

## 2018-09-14 ENCOUNTER — Ambulatory Visit (INDEPENDENT_AMBULATORY_CARE_PROVIDER_SITE_OTHER): Payer: BC Managed Care – PPO | Admitting: Sports Medicine

## 2018-09-14 DIAGNOSIS — L219 Seborrheic dermatitis, unspecified: Secondary | ICD-10-CM

## 2018-09-14 DIAGNOSIS — Z0000 Encounter for general adult medical examination without abnormal findings: Secondary | ICD-10-CM

## 2018-09-14 DIAGNOSIS — E7800 Pure hypercholesterolemia, unspecified: Secondary | ICD-10-CM

## 2018-09-14 DIAGNOSIS — L821 Other seborrheic keratosis: Secondary | ICD-10-CM | POA: Diagnosis not present

## 2018-09-14 DIAGNOSIS — N139 Obstructive and reflux uropathy, unspecified: Secondary | ICD-10-CM | POA: Diagnosis not present

## 2018-09-14 MED ORDER — TAMSULOSIN HCL 0.4 MG PO CAPS: 0.4000 mg | ORAL_CAPSULE | Freq: Every day | ORAL | 3 refills | Status: DC

## 2018-09-14 NOTE — Assessment & Plan Note (Addendum)
Needs to be scheduled with Maudie Mercury for a Medicare physical.

## 2018-09-14 NOTE — Progress Notes (Signed)
Subjective:    CC: Follow-up  HPI: Seborrheic dermatitis: Resolved.  Skin lesion: On the cheek below the left eye, appear to be a seborrheic keratosis, he would like this treated today.  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  He never saw the urologist after we referred him at the last visit.  I reviewed the past medical history, family history, social history, surgical history, and allergies today and no changes were needed.  Please see the problem list section below in epic for further details.  Past Medical History: Past Medical History:  Diagnosis Date  . Depression, major, single episode, moderate ([place]) 11/24/2016   11/24/2016 PHQ9 = 21, GAD7 = 12 12/22/2016 PHQ 9 = 16, GAD 7 = 11 01/19/2017 PHQ 9 = 14, GAD 7 = 6 02/18/2017 PHQ 9 = 9, GAD 7 = 2  . Enteritis due to Clostridium difficile 11/09/2013  . GERD (gastroesophageal reflux disease) 03/09/2017  . Hyperlipidemia    Past Surgical History: Past Surgical History:  Procedure Laterality Date  . broken leg    . PROSTATECTOMY     Social History: Social History   Socioeconomic History  . Marital status: Married    Spouse name: Not on file  . Number of children: Not on file  . Years of education: Not on file  . Highest education level: Not on file  Occupational History  . Not on file  Social Needs  . Financial resource strain: Not on file  . Food insecurity    Worry: Not on file    Inability: Not on file  . Transportation needs    Medical: Not on file    Non-medical: Not on file  Tobacco Use  . Smoking status: Former Research scientist (life sciences)  . Smokeless tobacco: Never Used  Substance and Sexual Activity  . Alcohol use: No  . Drug use: Not on file  . Sexual activity: Not on file  Lifestyle  . Physical activity    Days per week: Not on file   Minutes per session: Not on file  . Stress: Not on file  Relationships  . Social Herbalist on phone: Not on file    Gets together: Not on file    Attends religious service: Not on file    Active member of club or organization: Not on file    Attends meetings of clubs or organizations: Not on file    Relationship status: Not on file  Other Topics Concern  . Not on file  Social History Narrative  . Not on file   Family History: Family History  Problem Relation Age of Onset  . Alcohol abuse Mother   . Cancer Mother   . Depression Mother   . Alcohol abuse Father   . Cancer Maternal Aunt   . Alcohol abuse Maternal Uncle   . Cancer Maternal Uncle   . Diabetes Maternal Grandmother    Allergies: Allergies  Allergen Reactions  . Imodium A-D [Loperamide Hcl] Swelling    Causes throat swelling    Medications: See med rec.  Review of Systems: No fevers, chills, night sweats, weight loss, chest pain, or shortness of breath.   Objective:    General: Well Developed, well nourished, and in no acute distress.  Neuro: Alert and oriented x3, extra-ocular muscles intact, sensation grossly intact.  HEENT: Normocephalic, atraumatic, pupils equal round reactive to light, neck supple, no masses, no lymphadenopathy, thyroid nonpalpable.  Skin: Warm and dry, no rashes. Cardiac: Regular rate and rhythm, no murmurs rubs or gallops, no lower extremity edema.  Respiratory: Clear to auscultation bilaterally. Not using accessory muscles, speaking in full sentences.  Procedure:  Cryodestruction of left cheek seborrheic keratosis Consent obtained and verified. Time-out conducted. Noted no overlying erythema, induration, or other signs of local infection. Completed without difficulty using Cryo-Gun. Advised to call if fevers/chills, erythema, induration, drainage, or persistent bleeding.  Impression and Recommendations:    Obstructive uropathy post prostatectomy per patient  Discontinue Avodart. We are going to try Flomax again. He has not yet touch base with urology.  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 Low potency triamcinolone has done a great job with taking care of the scaliness. I performed cryotherapy on a seborrheic keratosis below his left orbit.   Annual physical exam Needs to be scheduled with Maudie Mercury for a Medicare physical.   ___________________________________________ Gwen Her. Dianah Field, M.D., ABFM., CAQSM. Primary Care and Sports Medicine  [facility]  Adjunct Professor of [place] of [facility] of Medicine

## 2018-09-14 NOTE — Assessment & Plan Note (Signed)
Seborrheic dermatitis and erythro-telangiectatic rosacea. Low potency triamcinolone has done a great job with taking care of the scaliness. I performed cryotherapy on a seborrheic keratosis below his left orbit.

## 2018-09-14 NOTE — Assessment & Plan Note (Signed)
Discontinue Avodart. We are going to try Flomax again. He has not yet touch base with urology.

## 2018-09-15 LAB — CBC
HCT: 37.8 % — ABNORMAL LOW (ref 38.5–50.0)
Hemoglobin: 13.2 g/dL (ref 13.2–17.1)
MCH: 36.6 pg — ABNORMAL HIGH (ref 27.0–33.0)
MCHC: 34.9 g/dL (ref 32.0–36.0)
MCV: 104.7 fL — ABNORMAL HIGH (ref 80.0–100.0)
MPV: 10.3 fL (ref 7.5–12.5)
Platelets: 205 10*3/uL (ref 140–400)
RBC: 3.61 10*6/uL — ABNORMAL LOW (ref 4.20–5.80)
RDW: 15.2 % — ABNORMAL HIGH (ref 11.0–15.0)
WBC: 3.5 10*3/uL — ABNORMAL LOW (ref 3.8–10.8)

## 2018-09-15 LAB — COMPLETE METABOLIC PANEL WITH GFR
AG Ratio: 1.8 (calc) (ref 1.0–2.5)
ALT: 10 U/L (ref 9–46)
AST: 17 U/L (ref 10–35)
Albumin: 4.2 g/dL (ref 3.6–5.1)
Alkaline phosphatase (APISO): 58 U/L (ref 35–144)
BUN: 16 mg/dL (ref 7–25)
CO2: 26 mmol/L (ref 20–32)
Calcium: 9.1 mg/dL (ref 8.6–10.3)
Chloride: 105 mmol/L (ref 98–110)
Creat: 0.78 mg/dL (ref 0.70–1.18)
GFR, Est African American: 100 mL/min/{1.73_m2} (ref >=60)
GFR, Est Non African American: 86 mL/min/{1.73_m2} (ref >=60)
Globulin: 2.3 g/dL (calc) (ref 1.9–3.7)
Glucose, Bld: 95 mg/dL (ref 65–99)
Potassium: 4.7 mmol/L (ref 3.5–5.3)
Sodium: 139 mmol/L (ref 135–146)
Total Bilirubin: 0.7 mg/dL (ref 0.2–1.2)
Total Protein: 6.5 g/dL (ref 6.1–8.1)

## 2018-09-15 LAB — LIPID PANEL W/REFLEX DIRECT LDL
Cholesterol: 140 mg/dL (ref <200)
HDL: 69 mg/dL (ref >=40)
LDL Cholesterol (Calc): 58 mg/dL (calc)
Non-HDL Cholesterol (Calc): 71 mg/dL (calc) (ref <130)
Total CHOL/HDL Ratio: 2 (calc) (ref <5.0)
Triglycerides: 53 mg/dL (ref <150)

## 2018-09-15 LAB — TSH: TSH: 2.03 mIU/L (ref 0.40–4.50)

## 2018-09-19 NOTE — Progress Notes (Signed)
Subjective:   Christian Lambert is a 78 y.o. male who presents for an Initial Medicare Annual Wellness Visit.  Review of Systems  No ROS.  Medicare Wellness Virtual Visit.  Visual/audio telehealth visit, UTA vital signs.   See social history for additional risk factors.    Cardiac Risk Factors include: advanced age (>31men, >28 women);dyslipidemia;male gender  Sleep patterns:  Getting 7-8 hours of sleep a night. Wakes up 2 times a night to void. Wakes up in the morning and feels refreshed  Home Safety/Smoke Alarms: Feels safe in home. Smoke alarms in place.  Living environment; Lives with wife in a 2 story home and steps have hand rails in place. Shower is a walk in shower with grab bars in place and seat in there. Seat Belt Safety/Bike Helmet: Wears seat belt.  Male:   CCS- Aged out    PSA- no need to test. Prostate removed Lab Results  Component Value Date   PSA <0.01 12/11/2014   PSA <0.01 11/24/2012       Objective:    Today's Vitals   09/20/18 1353  BP: (!) 120/44  Pulse: 64  Temp: 98 F (36.7 C)  TempSrc: Oral  SpO2: 98%  Weight: 163 lb (73.9 kg)  Height: 6\' 1"  (1.854 m)  PainSc: 1    Body mass index is 21.51 kg/m.  Advanced Directives 09/20/2018 04/23/2014  Does Patient Have a Medical Advance Directive? Yes No  Type of Paramedic of [place];Living will -  Does patient want to make changes to medical advance directive? No - Patient declined -  Copy of [place] in Chart? No - copy requested -  Would patient like information on creating a medical advance directive? - Yes - Educational materials given    Current Medications (verified) Outpatient Encounter Medications as of 09/20/2018  Medication Sig  . amphetamine-dextroamphetamine (ADDERALL) 10 MG tablet Take 1 tablet (10 mg total) by mouth 2 (two) times daily.  . cetirizine (ZYRTEC) 10 MG tablet Take 1 tablet (10 mg total) by mouth daily.  Marland Kitchen escitalopram (LEXAPRO)  10 MG tablet TAKE 1 TABLET BY MOUTH EVERY DAY  . fluticasone (FLONASE) 50 MCG/ACT nasal spray One spray in each nostril twice a day, use left hand for right nostril, and right hand for left nostril.  Marland Kitchen gemfibrozil (LOPID) 600 MG tablet TAKE 1 TABLET BY MOUTH TWICE A DAY WITH FOOD  . pyridOXINE (VITAMIN B-6) 100 MG tablet Take 100 mg by mouth daily.  . tamsulosin (FLOMAX) 0.4 MG CAPS capsule Take 1 capsule (0.4 mg total) by mouth daily after breakfast.  . TIMOLOL MALEATE OP Apply to eye.  . traZODone (DESYREL) 100 MG tablet TAKE 1 TABLET BY MOUTH EVERYDAY AT BEDTIME  . triamcinolone cream (KENALOG) 0.1 % Apply 1 application topically 2 (two) times daily.  Marland Kitchen zolpidem (AMBIEN CR) 12.5 MG CR tablet TAKE 1 TABLET BY MOUTH EVERY DAY AT BEDTIME AS NEEDED FOR SLEEP   No facility-administered encounter medications on file as of 09/20/2018.     Allergies (verified) Imodium a-d [loperamide hcl]   History: Past Medical History:  Diagnosis Date  . Depression, major, single episode, moderate ([place]) 11/24/2016   11/24/2016 PHQ9 = 21, GAD7 = 12 12/22/2016 PHQ 9 = 16, GAD 7 = 11 01/19/2017 PHQ 9 = 14, GAD 7 = 6 02/18/2017 PHQ 9 = 9, GAD 7 = 2  . Enteritis due to Clostridium difficile 11/09/2013  . GERD (gastroesophageal reflux disease) 03/09/2017  .  Hyperlipidemia    Past Surgical History:  Procedure Laterality Date  . broken leg    . PROSTATECTOMY     Family History  Problem Relation Age of Onset  . Alcohol abuse Mother   . Cancer Mother   . Depression Mother   . Alcohol abuse Father   . Cancer Maternal Aunt   . Alcohol abuse Maternal Uncle   . Cancer Maternal Uncle   . Diabetes Maternal Grandmother    Social History   Socioeconomic History  . Marital status: Married    Spouse name: Angela Nevin  . Number of children: 3  . Years of education: 6  . Highest education level: 6th grade  Occupational History  . 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 Financial resource  strain: Not hard at all  . Food insecurity    Worry: Never true    Inability: Never true  . Transportation needs    Medical: No    Non-medical: No  Tobacco Use  . Smoking status: Former Research scientist (life sciences)  . Smokeless tobacco: Never Used  Substance and Sexual Activity  . Alcohol use: Yes    Alcohol/week: 3.0 standard drinks    Types: 3 Cans of beer per week    Comment: 12 oz daily  . Drug use: Never  . Sexual activity: Not Currently  Lifestyle  . Physical activity    Days per week: 0 days    Minutes per session: 0 min  . Stress: Not at all  Relationships  . Social Herbalist on phone: More than three times a week    Gets together: Never    Attends religious service: Never    Active member of club or organization: No    Attends meetings of clubs or organizations: Never    Relationship status: Married  Other Topics Concern  . Not on file  Social History Narrative   Retired Media planner   Works in yards   Works on [place] given: Not Answered   Clinical Intake:  Pre-visit preparation completed: Yes  Pain : 0-10 Pain Score: 1  Pain Type: Chronic pain Pain Location: Hip Pain Orientation: Right Pain Radiating Towards: down leg Pain Descriptors / Indicators: Aching Pain Onset: More than a month ago Pain Frequency: Intermittent     Nutritional Risks: None Diabetes: No  How often do you need to have someone help you when you read instructions, pamphlets, or other written materials from your doctor or pharmacy?: 1 - Never What is the last grade level you completed in school?: 10  Interpreter Needed?: No  Information entered by :: Orlie Dakin, LPN  Activities of Daily Living In your present state of health, do you have any difficulty performing the following activities: 09/20/2018  Hearing? Y  Comment tinnitus in both ears  Vision? N  Difficulty concentrating or making decisions? Y  Comment patient states he has  dementia  Walking or climbing stairs? N  Dressing or bathing? N  Doing errands, shopping? N  Preparing Food and eating ? N  Using the Toilet? N  In the past six months, have you accidently leaked urine? Y  Comment wears a pad all the time  Do you have problems with loss of bowel control? N  Managing your Medications? N  Managing your Finances? N  Housekeeping or managing your Housekeeping? N  Some recent data might be hidden     Immunizations  and Health Maintenance Immunization History  Administered Date(s) Administered  . Influenza, High Dose Seasonal PF 11/26/2016  . Influenza,inj,Quad PF,6+ Mos 11/24/2012, 11/09/2013, 12/11/2014, 12/05/2015, 10/13/2017  . Pneumococcal Conjugate-13 12/11/2014  . Pneumococcal Polysaccharide-23 11/24/2012  . Tdap 10/16/2010, 11/24/2012  . Varicella Zoster Immune Globulin 11/24/2012   Health Maintenance Due  Topic Date Due  . INFLUENZA VACCINE  09/03/2018    Patient Care Team: Silverio Decamp, MD as PCP - General (Family Medicine)  Indicate any recent Medical Services you may have received from other than [facility] providers in the past year (date may be approximate).    Assessment:   This is a routine wellness examination for Christian Lambert.Physical assessment deferred to PCP   Hearing/Vision screen  Hearing Screening   125Hz  250Hz  500Hz  1000Hz  2000Hz  3000Hz  4000Hz  6000Hz  8000Hz   Right ear:           Left ear:           Comments: Hearing test done and patient reports back all 3 words correctly   Visual Acuity Screening   Right eye Left eye Both eyes  Without correction:     With correction: 20/25 20/40 20/20     Dietary issues and exercise activities discussed: Current Exercise Habits: The patient does not participate in regular exercise at present, Exercise limited by: None identified Diet Eats a healthy diet of fresh vegetables and fruits. Breakfast: skips Lunch: chicken and spinach casserole, vegetables. Does not eat beef. Only  chicken pork and fish Dinner: Vegetables and a protein. Drinks water daily. Drinks 1 cup of coffee in the morning.      Goals    . Exercise 3x per week (30 min per time)     Would like to exercise some to tone muscle back up      Depression Screen PHQ 2/9 Scores 09/20/2018 10/13/2017 01/19/2017 12/22/2016  PHQ - 2 Score 0 5 5 5   PHQ- 9 Score - 20 14 16     Fall Risk Fall Risk  09/20/2018  Falls in the past year? 1  Number falls in past yr: 1  Injury with Fall? 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   yes      Grab bars in the bathroom? yes      Handrails on the stairs?   yes      Adequate lighting?   yes   Cognitive Function:     6CIT Screen 09/20/2018  What Year? 0 points  What month? 0 points  What time? 0 points  Count back from 20 0 points  Months in reverse 0 points  Repeat phrase 2 points  Total Score 2    Screening Tests Health Maintenance  Topic Date Due  . INFLUENZA VACCINE  09/03/2018  . TETANUS/TDAP  11/25/2022  . PNA vac Low Risk Adult  Completed        Plan:      Christian Lambert , Thank you for taking time to come for your Medicare Wellness Visit. I appreciate your ongoing commitment to your health goals. Please review the following plan we discussed and let me know if I can assist you in the future.  Please schedule your next medicare wellness visit with me in 1 yr. Continue doing brain stimulating activities (  puzzles, reading, adult coloring books, staying active) to keep memory sharp.  Bring a copy of your living will and/or healthcare power of attorney to your next office visit.   These are the goals we discussed: Goals    . Exercise 3x per week (30 min per time)     Would like to exercise some to tone muscle back up       This is a list  of the screening recommended for you and due dates:  Health Maintenance  Topic Date Due  . Flu Shot  09/03/2018  . Tetanus Vaccine  11/25/2022  . Pneumonia vaccines  Completed     I have personally reviewed and noted the following in the patient's chart:   . Medical and social history . Use of alcohol, tobacco or illicit drugs  . Current medications and supplements . Functional ability and status . Nutritional status . Physical activity . Advanced directives . List of other physicians . Hospitalizations, surgeries, and ER visits in previous 12 months . Vitals . Screenings to include cognitive, depression, and falls . Referrals and appointments  In addition, I have reviewed and discussed with patient certain preventive protocols, quality metrics, and best practice recommendations. A written personalized care plan for preventive services as well as general preventive health recommendations were provided to patient.     Joanne Chars, LPN   [id number]/41/[id number]

## 2018-09-20 ENCOUNTER — Other Ambulatory Visit: Payer: Self-pay

## 2018-09-20 ENCOUNTER — Ambulatory Visit (INDEPENDENT_AMBULATORY_CARE_PROVIDER_SITE_OTHER): Payer: BC Managed Care – PPO | Admitting: *Deleted

## 2018-09-20 VITALS — BP 120/44 | HR 64 | Temp 98.0°F | Ht 73.0 in | Wt 163.0 lb

## 2018-09-20 DIAGNOSIS — Z0000 Encounter for general adult medical examination without abnormal findings: Secondary | ICD-10-CM | POA: Diagnosis not present

## 2018-09-20 NOTE — Patient Instructions (Signed)
Mr. Icenogle , Thank you for taking time to come for your Medicare Wellness Visit. I appreciate your ongoing commitment to your health goals. Please review the following plan we discussed and let me know if I can assist you in the future.  Please schedule your next medicare wellness visit with me in 1 yr. Continue doing brain stimulating activities (puzzles, reading, adult coloring books, staying active) to keep memory sharp.  Bring a copy of your living will and/or healthcare power of attorney to your next office visit. These are the goals we discussed: Goals    . Exercise 3x per week (30 min per time)     Would like to exercise some to tone muscle back up

## 2018-09-30 ENCOUNTER — Other Ambulatory Visit: Payer: Self-pay | Admitting: Sports Medicine

## 2018-09-30 DIAGNOSIS — F321 Major depressive disorder, single episode, moderate: Secondary | ICD-10-CM

## 2018-10-05 ENCOUNTER — Ambulatory Visit (INDEPENDENT_AMBULATORY_CARE_PROVIDER_SITE_OTHER): Payer: BC Managed Care – PPO | Admitting: Sports Medicine

## 2018-10-05 DIAGNOSIS — Z23 Encounter for immunization: Secondary | ICD-10-CM

## 2018-11-06 ENCOUNTER — Other Ambulatory Visit: Payer: Self-pay | Admitting: Sports Medicine

## 2018-11-06 DIAGNOSIS — F321 Major depressive disorder, single episode, moderate: Secondary | ICD-10-CM

## 2018-12-23 ENCOUNTER — Other Ambulatory Visit: Payer: Self-pay | Admitting: Sports Medicine

## 2018-12-23 DIAGNOSIS — L219 Seborrheic dermatitis, unspecified: Secondary | ICD-10-CM

## 2019-01-11 ENCOUNTER — Ambulatory Visit: Payer: BC Managed Care – PPO | Admitting: Sports Medicine

## 2019-01-17 ENCOUNTER — Other Ambulatory Visit: Payer: Self-pay

## 2019-01-17 ENCOUNTER — Encounter: Payer: Self-pay | Admitting: Sports Medicine

## 2019-01-17 ENCOUNTER — Ambulatory Visit (INDEPENDENT_AMBULATORY_CARE_PROVIDER_SITE_OTHER): Payer: BC Managed Care – PPO | Admitting: Sports Medicine

## 2019-01-17 DIAGNOSIS — L821 Other seborrheic keratosis: Secondary | ICD-10-CM | POA: Diagnosis not present

## 2019-01-17 NOTE — Assessment & Plan Note (Signed)
Cryotherapy of seborrheic keratoses on the shoulders, left medial clavicle, and 2 on the back. Return as needed.

## 2019-01-17 NOTE — Progress Notes (Signed)
Subjective:    CC: Skin lesions  HPI: Christian Lambert has noted a couple of hyperpigmented lesions on both shoulders, he would like these addressed.  I reviewed the past medical history, family history, social history, surgical history, and allergies today and no changes were needed.  Please see the problem list section below in epic for further details.  Past Medical History: Past Medical History:  Diagnosis Date  . Depression, major, single episode, moderate ([place]) 11/24/2016   11/24/2016 PHQ9 = 21, GAD7 = 12 12/22/2016 PHQ 9 = 16, GAD 7 = 11 01/19/2017 PHQ 9 = 14, GAD 7 = 6 02/18/2017 PHQ 9 = 9, GAD 7 = 2  . Enteritis due to Clostridium difficile 11/09/2013  . GERD (gastroesophageal reflux disease) 03/09/2017  . Hyperlipidemia    Past Surgical History: Past Surgical History:  Procedure Laterality Date  . broken leg    . PROSTATECTOMY     Social History: Social History   Socioeconomic History  . Marital status: Married    Spouse name: Angela Nevin  . Number of children: 3  . Years of education: 6  . Highest education level: 6th grade  Occupational History  . Occupation: truck Geophysicist/field seismologist    Comment: retired  Tobacco Use  . Smoking status: Former Research scientist (life sciences)  . Smokeless tobacco: Never Used  Substance and Sexual Activity  . Alcohol use: Yes    Alcohol/week: 3.0 standard drinks    Types: 3 Cans of beer per week    Comment: 12 oz daily  . Drug use: Never  . Sexual activity: Not Currently  Other Topics Concern  . Not on file  Social History Narrative   Retired Media planner   Works in yards   Works on [place] Strain: [place]   . Difficulty of Paying Living Expenses: Not hard at all  Food Insecurity: No Food Insecurity  . Worried About Charity fundraiser in the Last Year: Never true  . Ran Out of Food in the Last Year: Never true  Transportation Needs: No Transportation Needs  . Lack of Transportation (Medical): No    . Lack of Transportation (Non-Medical): No  Physical Activity: Inactive  . Days of Exercise per Week: 0 days  . Minutes of Exercise per Session: 0 min  Stress: No Stress Concern Present  . Feeling of Stress : Not at all  Social Connections: Somewhat Isolated  . Frequency of Communication with Friends and Family: More than three times a week  . Frequency of Social Gatherings with Friends and Family: Never  . Attends Religious Services: Never  . Active Member of Clubs or Organizations: No  . Attends Archivist Meetings: Never  . Marital Status: Married   Family History: Family History  Problem Relation Age of Onset  . Alcohol abuse Mother   . Cancer Mother   . Depression Mother   . Alcohol abuse Father   . Cancer Maternal Aunt   . Alcohol abuse Maternal Uncle   . Cancer Maternal Uncle   . Diabetes Maternal Grandmother    Allergies: Allergies  Allergen Reactions  . Imodium A-D [Loperamide Hcl] Swelling    Causes throat swelling    Medications: See med rec.  Review of Systems: No fevers, chills, night sweats, weight loss, chest pain, or shortness of breath.   Objective:    General: Well Developed, well nourished, and in no acute distress.  Neuro: Alert and oriented x3,  extra-ocular muscles intact, sensation grossly intact.  HEENT: Normocephalic, atraumatic, pupils equal round reactive to light, neck supple, no masses, no lymphadenopathy, thyroid nonpalpable.  Skin: Warm and dry, no rashes.  Seborrheic keratoses on the shoulders, left upper chest, and 2 on the back. Cardiac: Regular rate and rhythm, no murmurs rubs or gallops, no lower extremity edema.  Respiratory: Clear to auscultation bilaterally. Not using accessory muscles, speaking in full sentences.  Procedure:  Cryodestruction of #5 seborrheic keratoses Consent obtained and verified. Time-out conducted. Noted no overlying erythema, induration, or other signs of local infection. Completed without  difficulty using Cryo-Gun. Advised to call if fevers/chills, erythema, induration, drainage, or persistent bleeding.  Impression and Recommendations:    Seborrheic keratosis Cryotherapy of seborrheic keratoses on the shoulders, left medial clavicle, and 2 on the back. Return as needed.   ___________________________________________ Gwen Her. Dianah Field, M.D., ABFM., CAQSM. Primary Care and Sports Medicine [facility]  Adjunct Professor of [place] of [facility] of Medicine

## 2019-01-18 ENCOUNTER — Ambulatory Visit: Payer: BC Managed Care – PPO | Admitting: Sports Medicine

## 2019-01-20 ENCOUNTER — Other Ambulatory Visit: Payer: Self-pay | Admitting: *Deleted

## 2019-01-20 ENCOUNTER — Other Ambulatory Visit: Payer: Self-pay | Admitting: Sports Medicine

## 2019-01-20 DIAGNOSIS — F5101 Primary insomnia: Secondary | ICD-10-CM

## 2019-01-20 MED ORDER — TRAZODONE HCL 100 MG PO TABS: ORAL_TABLET | ORAL | 2 refills | Status: DC

## 2019-01-24 ENCOUNTER — Other Ambulatory Visit: Payer: Self-pay | Admitting: Sports Medicine

## 2019-01-24 DIAGNOSIS — F321 Major depressive disorder, single episode, moderate: Secondary | ICD-10-CM

## 2019-03-29 ENCOUNTER — Other Ambulatory Visit: Payer: Self-pay | Admitting: Sports Medicine

## 2019-03-29 DIAGNOSIS — F321 Major depressive disorder, single episode, moderate: Secondary | ICD-10-CM

## 2019-06-03 ENCOUNTER — Other Ambulatory Visit: Payer: Self-pay | Admitting: Sports Medicine

## 2019-06-06 ENCOUNTER — Other Ambulatory Visit: Payer: Self-pay | Admitting: Sports Medicine

## 2019-06-06 DIAGNOSIS — F321 Major depressive disorder, single episode, moderate: Secondary | ICD-10-CM

## 2019-06-20 ENCOUNTER — Ambulatory Visit: Payer: BC Managed Care – PPO | Admitting: Sports Medicine

## 2019-06-21 ENCOUNTER — Ambulatory Visit (INDEPENDENT_AMBULATORY_CARE_PROVIDER_SITE_OTHER): Payer: BC Managed Care – PPO | Admitting: Sports Medicine

## 2019-06-21 ENCOUNTER — Encounter: Payer: Self-pay | Admitting: Sports Medicine

## 2019-06-21 ENCOUNTER — Other Ambulatory Visit: Payer: Self-pay

## 2019-06-21 DIAGNOSIS — H6191 Disorder of right external ear, unspecified: Secondary | ICD-10-CM | POA: Diagnosis not present

## 2019-06-21 DIAGNOSIS — M1611 Unilateral primary osteoarthritis, right hip: Secondary | ICD-10-CM

## 2019-06-21 DIAGNOSIS — N139 Obstructive and reflux uropathy, unspecified: Secondary | ICD-10-CM

## 2019-06-21 DIAGNOSIS — L718 Other rosacea: Secondary | ICD-10-CM

## 2019-06-21 MED ORDER — TAMSULOSIN HCL 0.4 MG PO CAPS: 0.4000 mg | ORAL_CAPSULE | Freq: Every day | ORAL | 3 refills | Status: DC

## 2019-06-21 NOTE — Addendum Note (Signed)
Addended by: Silverio Decamp on: 06/21/2019 02:49 PM   Modules accepted: Orders

## 2019-06-21 NOTE — Assessment & Plan Note (Signed)
Referral for hip replacement

## 2019-06-21 NOTE — Assessment & Plan Note (Signed)
Christian Lambert returns, he has a friable lesion on his right ear, looks like a squamous cell or basal cell carcinoma, he will return for excision with half centimeter margins.

## 2019-06-21 NOTE — Assessment & Plan Note (Signed)
Christian Lambert returns, he continues to have urinary urgency, frequency with occasional weak stream and dribbling, he is post prostatectomy per his report. We stopped Avodart and started Flomax which seemed to work well, he has since discontinued this. I would like a urinalysis, restarting Flomax, he does endorse a large increase in alcohol consumption. If urinalysis is positive we will likely add ciprofloxacin.

## 2019-06-21 NOTE — Progress Notes (Addendum)
    Procedures performed today:    None.  Independent interpretation of notes and tests performed by another provider:   None.  Brief History, Exam, Impression, and Recommendations:    Skin lesion of right ear Christian Lambert returns, he has a friable lesion on his right ear, looks like a squamous cell or basal cell carcinoma, he will return for excision with half centimeter margins.  Primary osteoarthritis of right hip Referral for hip replacement  Obstructive uropathy post prostatectomy per patient Christian Lambert returns, he continues to have urinary urgency, frequency with occasional weak stream and dribbling, he is post prostatectomy per his report. We stopped Avodart and started Flomax which seemed to work well, he has since discontinued this. I would like a urinalysis, restarting Flomax, he does endorse a large increase in alcohol consumption. If urinalysis is positive we will likely add ciprofloxacin.  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 Certainly azelaic acid would be another option, at this point I would like dermatology to weigh in.    ___________________________________________ Gwen Her. Dianah Field, M.D., ABFM., CAQSM. Primary Care and [place] Instructor of [place] of [facility] of Medicine

## 2019-06-21 NOTE — Assessment & Plan Note (Addendum)
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 Certainly azelaic acid would be another option, at this point I would like dermatology to weigh in.

## 2019-06-22 LAB — URINALYSIS W MICROSCOPIC + REFLEX CULTURE
Bacteria, UA: NONE SEEN /HPF
Bilirubin Urine: NEGATIVE
Glucose, UA: NEGATIVE
Hgb urine dipstick: NEGATIVE
Hyaline Cast: NONE SEEN /LPF
Ketones, ur: NEGATIVE
Leukocyte Esterase: NEGATIVE
Nitrites, Initial: NEGATIVE
Protein, ur: NEGATIVE
RBC / HPF: NONE SEEN /HPF (ref 0–2)
Specific Gravity, Urine: 1.01 (ref 1.001–1.03)
Squamous Epithelial / HPF: NONE SEEN /HPF (ref <=5)
WBC, UA: NONE SEEN /HPF (ref 0–5)
pH: 7.5 (ref 5.0–8.0)

## 2019-06-22 LAB — NO CULTURE INDICATED

## 2019-06-23 ENCOUNTER — Ambulatory Visit (INDEPENDENT_AMBULATORY_CARE_PROVIDER_SITE_OTHER): Payer: BC Managed Care – PPO | Admitting: Sports Medicine

## 2019-06-23 ENCOUNTER — Other Ambulatory Visit: Payer: Self-pay

## 2019-06-23 ENCOUNTER — Other Ambulatory Visit: Payer: Self-pay | Admitting: Sports Medicine

## 2019-06-23 DIAGNOSIS — H6191 Disorder of right external ear, unspecified: Secondary | ICD-10-CM | POA: Diagnosis not present

## 2019-06-23 DIAGNOSIS — C44212 Basal cell carcinoma of skin of right ear and external auricular canal: Secondary | ICD-10-CM

## 2019-06-23 NOTE — Patient Instructions (Signed)
[facility] Address: [address] [place], [place] [address] Phone: [phone] Fax: [phone]

## 2019-06-23 NOTE — Addendum Note (Signed)
Addended by: Beatris Ship L on: 06/23/2019 02:06 PM   Modules accepted: Orders

## 2019-06-23 NOTE — Progress Notes (Addendum)
    Procedures performed today:    Procedure: Shave biopsy of right ear suspicious skin lesion, 1.1 cm Risks, benefits, and alternatives explained and consent obtained. Time out conducted. Surface prepped with alcohol. 1cc lidocaine with epinephine infiltrated in a field block. Adequate anesthesia ensured. Area prepped and draped in a sterile fashion. Excision performed with: Using a derma blade I shaved into the deep dermis then used a Hyfrecator to achieve hemostasis and destroy residual underlying suspicious cells Hemostasis achieved. Pt stable.  Independent interpretation of notes and tests performed by another provider:   None.  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 He will follow-up with me in 2 weeks for a wound check.  Dermatopathology shows a basal cell carcinoma, this is a skin cancer, with the shave biopsy combined with aggressive hyfrecation this is likely a surgical cure.    ___________________________________________ Gwen Her. Dianah Field, M.D., ABFM., CAQSM. Primary Care and [place] Instructor of [place] of [facility] of Medicine

## 2019-06-23 NOTE — Assessment & Plan Note (Addendum)
Christian Lambert returns, he had a friable lesion on his right ear, this looks like a basal cell carcinoma, I did a shave biopsy today with aggressive hyfrecation of the base to destroy any residual cells. He will follow-up with me in 2 weeks for a wound check.  Dermatopathology shows a basal cell carcinoma, this is a skin cancer, with the shave biopsy combined with aggressive hyfrecation this is likely a surgical cure.

## 2019-07-02 ENCOUNTER — Other Ambulatory Visit: Payer: Self-pay | Admitting: Sports Medicine

## 2019-07-02 DIAGNOSIS — F321 Major depressive disorder, single episode, moderate: Secondary | ICD-10-CM

## 2019-07-12 ENCOUNTER — Ambulatory Visit (INDEPENDENT_AMBULATORY_CARE_PROVIDER_SITE_OTHER): Payer: BC Managed Care – PPO | Admitting: Sports Medicine

## 2019-07-12 ENCOUNTER — Encounter: Payer: Self-pay | Admitting: Sports Medicine

## 2019-07-12 VITALS — BP 107/63 | HR 71 | Ht 73.0 in | Wt 167.0 lb

## 2019-07-12 DIAGNOSIS — M1611 Unilateral primary osteoarthritis, right hip: Secondary | ICD-10-CM | POA: Diagnosis not present

## 2019-07-12 DIAGNOSIS — Z01818 Encounter for other preprocedural examination: Secondary | ICD-10-CM | POA: Diagnosis not present

## 2019-07-12 NOTE — Assessment & Plan Note (Signed)
Christian Lambert returns, he is scheduled for his total hip arthroplasty. Today we did a preoperative risk assessment evaluation, he has greater than 4 metabolic equivalents of cardiac capacity, he is not taking any blood thinners, he has had surgeries in the past without any complications. I am going to check the requested labs including CBC with differential, CMP, PT/INR. ECG done and normal with the exception of first-degree AV block which can be normal in patients this age. I have no other perioperative recommendations, Dr. Berenice Primas may proceed with arthroplasty.

## 2019-07-12 NOTE — Progress Notes (Signed)
    Procedures performed today:    ECG obtained and reviewed by me, normal rate, normal rhythm, first-degree AV block, no QRS, or ST changes.  Independent interpretation of notes and tests performed by another provider:   None.  Brief History, Exam, Impression, and Recommendations:    Primary osteoarthritis of right hip Christian Lambert returns, he is scheduled for his total hip arthroplasty. Today we did a preoperative risk assessment evaluation, he has greater than 4 metabolic equivalents of cardiac capacity, he is not taking any blood thinners, he has had surgeries in the past without any complications. I am going to check the requested labs including CBC with differential, CMP, PT/INR. ECG done and normal with the exception of first-degree AV block which can be normal in patients this age. I have no other perioperative recommendations, Dr. Berenice Primas may proceed with arthroplasty.    ___________________________________________ Gwen Her. Dianah Field, M.D., ABFM., CAQSM. Primary Care and [place] Instructor of [place] of [facility] of Medicine

## 2019-07-13 LAB — CBC WITH DIFFERENTIAL/PLATELET
Absolute Monocytes: 228 cells/uL (ref 200–950)
Basophils Absolute: 20 cells/uL (ref 0–200)
Basophils Relative: 0.6 %
Eosinophils Absolute: 51 cells/uL (ref 15–500)
Eosinophils Relative: 1.5 %
HCT: 35.8 % — ABNORMAL LOW (ref 38.5–50.0)
Hemoglobin: 12.6 g/dL — ABNORMAL LOW (ref 13.2–17.1)
Lymphs Abs: 928 cells/uL (ref 850–3900)
MCH: 36.5 pg — ABNORMAL HIGH (ref 27.0–33.0)
MCHC: 35.2 g/dL (ref 32.0–36.0)
MCV: 103.8 fL — ABNORMAL HIGH (ref 80.0–100.0)
MPV: 10.3 fL (ref 7.5–12.5)
Monocytes Relative: 6.7 %
Neutro Abs: 2173 cells/uL (ref 1500–7800)
Neutrophils Relative %: 63.9 %
Platelets: 198 10*3/uL (ref 140–400)
RBC: 3.45 10*6/uL — ABNORMAL LOW (ref 4.20–5.80)
RDW: 15.6 % — ABNORMAL HIGH (ref 11.0–15.0)
Total Lymphocyte: 27.3 %
WBC: 3.4 10*3/uL — ABNORMAL LOW (ref 3.8–10.8)

## 2019-07-13 LAB — COMPLETE METABOLIC PANEL WITH GFR
AG Ratio: 2.2 (calc) (ref 1.0–2.5)
ALT: 8 U/L — ABNORMAL LOW (ref 9–46)
AST: 15 U/L (ref 10–35)
Albumin: 4.1 g/dL (ref 3.6–5.1)
Alkaline phosphatase (APISO): 63 U/L (ref 35–144)
BUN: 13 mg/dL (ref 7–25)
CO2: 27 mmol/L (ref 20–32)
Calcium: 8.7 mg/dL (ref 8.6–10.3)
Chloride: 106 mmol/L (ref 98–110)
Creat: 0.94 mg/dL (ref 0.70–1.18)
GFR, Est African American: 89 mL/min/{1.73_m2} (ref >=60)
GFR, Est Non African American: 77 mL/min/{1.73_m2} (ref >=60)
Globulin: 1.9 g/dL (calc) (ref 1.9–3.7)
Glucose, Bld: 102 mg/dL (ref 65–139)
Potassium: 4.5 mmol/L (ref 3.5–5.3)
Sodium: 139 mmol/L (ref 135–146)
Total Bilirubin: 0.6 mg/dL (ref 0.2–1.2)
Total Protein: 6 g/dL — ABNORMAL LOW (ref 6.1–8.1)

## 2019-07-13 LAB — PROTIME-INR
INR: 0.9
Prothrombin Time: 10 s (ref 9.0–11.5)

## 2019-07-13 LAB — APTT: aPTT: 26 s (ref 23–32)

## 2019-08-04 ENCOUNTER — Ambulatory Visit (INDEPENDENT_AMBULATORY_CARE_PROVIDER_SITE_OTHER): Payer: BC Managed Care – PPO | Admitting: Rehabilitative and Restorative Service Providers"

## 2019-08-04 ENCOUNTER — Other Ambulatory Visit: Payer: Self-pay

## 2019-08-04 DIAGNOSIS — M6281 Muscle weakness (generalized): Secondary | ICD-10-CM

## 2019-08-04 DIAGNOSIS — R2689 Other abnormalities of gait and mobility: Secondary | ICD-10-CM

## 2019-08-04 DIAGNOSIS — M25551 Pain in right hip: Secondary | ICD-10-CM

## 2019-08-04 NOTE — Patient Instructions (Signed)
Access Code: [email or web address] URL: [email or web address] Date: 08/04/2019 Prepared by: Rudell Cobb  Exercises Supine Quadricep Sets - 2 x daily - 7 x weekly - 1 sets - 10 reps Supine Heel Slide - 2 x daily - 7 x weekly - 1 sets - 10 reps Supine Hip Abduction - 2 x daily - 7 x weekly - 1 sets - 5 reps Seated Isometric Hip Adduction with Ball - 2 x daily - 7 x weekly - 1 sets - 10 reps

## 2019-08-04 NOTE — Therapy (Signed)
[facility] [place], [place], [address] Phone: [phone]   Fax:  [phone]  Physical Therapy Evaluation  Patient Details  Name: Christian Lambert MRN: [id number] Date of Birth: Jun 15, 1940 Referring Provider (PT): Dorna Leitz, MD   Encounter Date: 08/04/2019   PT End of Session - 08/04/19 1308    Visit Number 1    Number of Visits 12    Date for PT Re-Evaluation 09/15/19    PT Start Time 1021    PT Stop Time 1100    PT Time Calculation (min) 39 min           Past Medical History:  Diagnosis Date  . Depression, major, single episode, moderate ([place]) 11/24/2016   11/24/2016 PHQ9 = 21, GAD7 = 12 12/22/2016 PHQ 9 = 16, GAD 7 = 11 01/19/2017 PHQ 9 = 14, GAD 7 = 6 02/18/2017 PHQ 9 = 9, GAD 7 = 2  . Enteritis due to Clostridium difficile 11/09/2013  . GERD (gastroesophageal reflux disease) 03/09/2017  . Hyperlipidemia     Past Surgical History:  Procedure Laterality Date  . broken leg    . PROSTATECTOMY      There were no vitals filed for this visit.    Subjective Assessment - 08/04/19 1029    Subjective The patient is s/p R THR (anterior) 08/02/19.  He reports 6/10 pain.    Patient is accompained by: Family member   spouse   Pertinent History h/o tibia/fibular fracture years ago (wife notes his R leg turns out prior to surgery)    Currently in Pain? Yes    Pain Score 6    up to 6/10 before pain meds   Pain Location Hip    Pain Orientation Right    Pain Descriptors / Indicators Aching;Sore    Pain Type Surgical pain    Pain Onset In the past 7 days    Pain Frequency Intermittent    Aggravating Factors  hip flexion, hip extension    Pain Relieving Factors pain meds              [facility] PT Assessment - 08/04/19 1030      Assessment   Medical Diagnosis s/p R anterior THR    Referring Provider (PT) Dorna Leitz, MD    Onset Date/Surgical Date 08/02/19    Hand Dominance Right    Prior Therapy none        Precautions   Precautions Anterior Hip      Restrictions   Weight Bearing Restrictions Yes    RLE Weight Bearing Weight bearing as tolerated      Balance Screen   Has the patient fallen in the past 6 months No    Has the patient had a decrease in activity level because of a fear of falling?  No    Is the patient reluctant to leave their home because of a fear of falling?  No      Home Environment   Living Environment Private residence    Living Arrangements Spouse/significant other    Type of [place] to enter    Entrance Stairs-Number of Steps 12    Entrance Stairs-Rails Right   and a wall   Home Layout One level   split [place] - 2 wheels;Cane - single point    Additional Comments cane has wide tip      Prior Function  Level of Independence Independent      Observation/Other Assessments   Focus on Therapeutic Outcomes (FOTO)  74% limited      Sensation   Light Touch Appears Intact      Posture/Postural Control   Posture Comments dec'd weight shifting to the right      ROM / Strength   AROM / PROM / Strength AROM;Strength;PROM      AROM   Overall AROM  Deficits    Overall AROM Comments limited due to weakness in AROM    AROM Assessment Site Hip    Right/Left Hip --      PROM   Overall PROM  Deficits    PROM Assessment Site Hip    Right/Left Hip Right    Right Hip Extension 0    Right Hip Flexion 85      Strength   Overall Strength Deficits    Overall Strength Comments unable to perform SLR, uses L LE to  lift R LE onto mat      Flexibility   Soft Tissue Assessment /Muscle Length yes    Hamstrings tightness    Quadriceps tightness      Bed Mobility   Bed Mobility Supine to Sit;Sit to Supine    Supine to Sit Supervision/Verbal cueing    Sit to Supine Supervision/Verbal cueing      Transfers   Transfers Sit to Stand;Stand to Sit    Sit to Stand 5: Supervision    Stand to Sit 5: Supervision       Ambulation/Gait   Ambulation/Gait Yes    Ambulation/Gait Assistance 5: Supervision    Ambulation/Gait Assistance Details cues to dec'd ER of R hip at pelvis    Ambulation Distance (Feet) 75 Feet    Assistive device Rolling walker    Gait Pattern Step-to pattern;Decreased stance time - right;Decreased step length - left    Ambulation Surface Level;Indoor    Gait velocity 0.98 ft/sec    Stairs Yes    Stairs Assistance 5: Supervision    Stair Management Technique Two rails;Step to pattern    Number of Stairs 4                      Objective measurements completed on examination: See above findings.       [facility] Adult PT Treatment/Exercise - 08/04/19 1030      Exercises   Exercises Knee/Hip      Knee/Hip Exercises: Seated   Ball Squeeze isometric hip adduction      Knee/Hip Exercises: Supine   Quad Sets Strengthening;Right;10 reps    Heel Slides Strengthening;Right;10 reps    Other Supine Knee/Hip Exercises supine hip abduction x 10 reps with cues on dec'ing ER                  PT Education - 08/04/19 1049    Education Details HEP initiated    [employer]) Educated Patient;Spouse    Methods Explanation;Demonstration;Handout    Comprehension Returned demonstration;Verbalized understanding               PT Long Term Goals - 08/04/19 1308      PT LONG TERM GOAL #1   Title The patient will be indep with HEP.    Time 6    Period Weeks    Target Date 09/15/19      PT LONG TERM GOAL #2   Title The patient will reduce functional limitation from 74% to 34% limitation.  Time 6    Period Weeks    Target Date 09/15/19      PT LONG TERM GOAL #3   Title The patient will ambulate without a device with gait speed improved to > or equal to 2.6 ft/sec (from 0.98 ft/sec).    Time 6    Period Weeks    Target Date 09/15/19      PT LONG TERM GOAL #4   Title The patient will move sit<>stand and sit<>supine independently without using compensatory  strategies.    Time 6    Period Weeks    Target Date 09/15/19      PT LONG TERM GOAL #5   Title The patient will negotiate 12 steps with one handrail and reciprocal pattern mod indep.    Time 6    Period Weeks    Target Date 09/15/19                  Plan - 08/04/19 1318    Clinical Impression Statement The patient is a 79 yo male presenting to OP physical therapy s/p R THR on 08/02/19.  He has functional limitations of dec'd household/community ambulation, dec'd ability to perform bed mobility and household tasks.  PT to address deficits to return to prior functional status.    Personal Factors and Comorbidities Comorbidity 1    Comorbidities h/o prior lower leg fx R LE (years ago)    Examination-Activity Limitations Bed Mobility;Bathing;Bend;Squat;Stairs;Locomotion Level    Examination-Participation Restrictions Community Activity;Driving    Stability/Clinical Decision Making Stable/Uncomplicated    Clinical Decision Making Low    Rehab Potential Good    PT Frequency 2x / week    PT Duration 6 weeks    PT Treatment/Interventions ADLs/Self Care Home Management;Gait training;Stair training;Functional mobility training;Therapeutic activities;Therapeutic exercise;Neuromuscular re-education;Patient/family education;Taping;Passive range of motion;Manual techniques;Electrical Stimulation;Moist Heat;Cryotherapy    PT Next Visit Plan progress LE strength, HEP, gait and stair negotiation.    PT Home Exercise Plan [email or web address]    Consulted and Agree with Plan of Care Patient           Patient will benefit from skilled therapeutic intervention in order to improve the following deficits and impairments:  Abnormal gait, Pain, Hypomobility, Impaired flexibility, Decreased strength, Decreased range of motion, Decreased balance  Visit Diagnosis: Pain in right hip  Muscle weakness (generalized)  Other abnormalities of gait and mobility     Problem List Patient Active Problem List    Diagnosis Date Noted  . Primary osteoarthritis of right hip 06/21/2019  . Seborrheic keratosis 08/24/2018  . Rhinitis 08/24/2018  . CFS (chronic fatigue syndrome) 10/13/2017  . GERD (gastroesophageal reflux disease) 03/09/2017  . Acne rosacea, erythrotelangiectatic type 02/24/2017  . Depression, major, single episode, moderate ([place]) 11/24/2016  . Macrocytosis without anemia 09/02/2016  . Dysphagia 03/10/2016  . Olecranon bursitis of right elbow 06/10/2015  . Neck mass 12/11/2014  . Lumbar degenerative disc disease 04/16/2014  . Enteritis due to Clostridium difficile 11/09/2013  . Left hamstring muscle strain 08/14/2013  . 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 Obstructive uropathy post prostatectomy per patient 11/24/2012  . Insomnia 11/24/2012  . Seborrheic dermatitis 11/24/2012  . Annual physical exam 11/24/2012  . Hyperlipidemia 11/24/2012    [place], PT  08/04/2019, 4:56 PM  [facility] [place], [place], [address] Phone: [phone]   Fax:  [phone]  Name: Christian Lambert MRN: [id number] Date of Birth: 1940/04/19

## 2019-08-07 ENCOUNTER — Other Ambulatory Visit: Payer: Self-pay | Admitting: Sports Medicine

## 2019-08-07 DIAGNOSIS — F321 Major depressive disorder, single episode, moderate: Secondary | ICD-10-CM

## 2019-08-10 ENCOUNTER — Encounter: Payer: Self-pay | Admitting: Physical Therapy

## 2019-08-10 ENCOUNTER — Ambulatory Visit (INDEPENDENT_AMBULATORY_CARE_PROVIDER_SITE_OTHER): Payer: BC Managed Care – PPO | Admitting: Physical Therapy

## 2019-08-10 ENCOUNTER — Other Ambulatory Visit: Payer: Self-pay

## 2019-08-10 DIAGNOSIS — R2689 Other abnormalities of gait and mobility: Secondary | ICD-10-CM

## 2019-08-10 DIAGNOSIS — M6281 Muscle weakness (generalized): Secondary | ICD-10-CM

## 2019-08-10 DIAGNOSIS — M25551 Pain in right hip: Secondary | ICD-10-CM

## 2019-08-10 NOTE — Therapy (Signed)
[facility] [place] [address] [place], [place], [address] Phone: [phone]   Fax:  [phone]  Physical Therapy Treatment  Patient Details  Name: Christian Lambert MRN: [id number] Date of Birth: June 01, 1940 Referring Provider (PT): Dorna Leitz, MD   Encounter Date: 08/10/2019   PT End of Session - 08/10/19 1402    Visit Number 2    Number of Visits 12    Date for PT Re-Evaluation 09/15/19    PT Start Time 3875    PT Stop Time 6433    PT Time Calculation (min) 40 min    Activity Tolerance Patient tolerated treatment well           Past Medical History:  Diagnosis Date   Depression, major, single episode, moderate ([place]) 11/24/2016   11/24/2016 PHQ9 = 21, GAD7 = 12 12/22/2016 PHQ 9 = 16, GAD 7 = 11 01/19/2017 PHQ 9 = 14, GAD 7 = 6 02/18/2017 PHQ 9 = 9, GAD 7 = 2   Enteritis due to Clostridium difficile 11/09/2013   GERD (gastroesophageal reflux disease) 03/09/2017   Hyperlipidemia     Past Surgical History:  Procedure Laterality Date   broken leg     PROSTATECTOMY      There were no vitals filed for this visit.   Subjective Assessment - 08/10/19 1403    Subjective Pt ambulating with SPC in community, "I quit using the cane in the house yesterday".    Currently in Pain? Yes    Pain Score 2     Pain Location Hip    Pain Orientation Right    Pain Descriptors / Indicators Sore    Aggravating Factors  turn the wrong way, exercise.    Pain Relieving Factors pain meds              [facility] PT Assessment - 08/10/19 0001      Assessment   Medical Diagnosis s/p R anterior THR    Referring Provider (PT) Dorna Leitz, MD    Onset Date/Surgical Date 08/02/19    Hand Dominance Right    Prior Therapy none            [facility] Adult PT Treatment/Exercise - 08/10/19 0001      Knee/Hip Exercises: Stretches   Passive Hamstring Stretch --    Other Knee/Hip Stretches prone position (during ktape application) x 4 min  (minimal stretch felt.       Knee/Hip Exercises: Aerobic   Nustep L4: legs only x 5 min       Knee/Hip Exercises: Standing   Heel Raises Both;1 set;10 reps   with toe raises    Hip Abduction Stengthening;Right;Left;1 set;10 reps    Hip Extension Stengthening;Right;Left;1 set;10 reps    Forward Step Up Right;1 set;5 reps;Hand Hold: 2;Step Height: 6"    Step Down Left;Hand Hold: 2;Step Height: 4"   3 reps   Functional Squat 5 reps    Stairs reciprocal pattern with bilat hand rail on 4" step x 9 stairs      Knee/Hip Exercises: Seated   Ball Squeeze --   verbally reviewed   Sit to Sand 5 reps;without UE support      Knee/Hip Exercises: Supine   Quad Sets Strengthening;Right;10 reps    Heel Slides Strengthening;Right;5 reps      Manual Therapy   Manual Therapy Taping    Kinesiotex Edema      Kinesiotix   Edema 4 strips of sensitive skin Rock tape applied in squid  shape across Rt posterior thigh to assist in edema/ bruising.                   PT Education - 08/10/19 1720    Education Details ktape info    Person(s) Educated Patient;Spouse    Methods Explanation;Handout    Comprehension Verbalized understanding               PT Long Term Goals - 08/04/19 1308      PT LONG TERM GOAL #1   Title The patient will be indep with HEP.    Time 6    Period Weeks    Target Date 09/15/19      PT LONG TERM GOAL #2   Title The patient will reduce functional limitation from 74% to 34% limitation.    Time 6    Period Weeks    Target Date 09/15/19      PT LONG TERM GOAL #3   Title The patient will ambulate without a device with gait speed improved to > or equal to 2.6 ft/sec (from 0.98 ft/sec).    Time 6    Period Weeks    Target Date 09/15/19      PT LONG TERM GOAL #4   Title The patient will move sit<>stand and sit<>supine independently without using compensatory strategies.    Time 6    Period Weeks    Target Date 09/15/19      PT LONG TERM GOAL #5   Title  The patient will negotiate 12 steps with one handrail and reciprocal pattern mod indep.    Time 6    Period Weeks    Target Date 09/15/19                 Plan - 08/10/19 1719    Clinical Impression Statement Pt presents with dark purple bruising on Rt post thigh, into calf.  Ktape applied to area to assist with edema/ bruising reduction.  Pt tolerated all exercises well, with min increase in discomfort.  Goals are ongoing.    Personal Factors and Comorbidities Comorbidity 1    Comorbidities h/o prior lower leg fx R LE (years ago)    Examination-Activity Limitations Bed Mobility;Bathing;Bend;Squat;Stairs;Locomotion Level    Examination-Participation Restrictions Community Activity;Driving    Stability/Clinical Decision Making Stable/Uncomplicated    Rehab Potential Good    PT Frequency 2x / week    PT Duration 6 weeks    PT Treatment/Interventions ADLs/Self Care Home Management;Gait training;Stair training;Functional mobility training;Therapeutic activities;Therapeutic exercise;Neuromuscular re-education;Patient/family education;Taping;Passive range of motion;Manual techniques;Electrical Stimulation;Moist Heat;Cryotherapy    PT Next Visit Plan progress LE strength, HEP, gait and stair negotiation.    PT Home Exercise Plan [email or web address]    Consulted and Agree with Plan of Care Patient           Patient will benefit from skilled therapeutic intervention in order to improve the following deficits and impairments:  Abnormal gait, Pain, Hypomobility, Impaired flexibility, Decreased strength, Decreased range of motion, Decreased balance  Visit Diagnosis: Pain in right hip  Muscle weakness (generalized)  Other abnormalities of gait and mobility     Problem List Patient Active Problem List   Diagnosis Date Noted   Primary osteoarthritis of right hip 06/21/2019   Seborrheic keratosis 08/24/2018   Rhinitis 08/24/2018   CFS (chronic fatigue syndrome) 10/13/2017   GERD  (gastroesophageal reflux disease) 03/09/2017   Acne rosacea, erythrotelangiectatic type 02/24/2017   Depression, major, single episode, moderate ([place]) 11/24/2016   Macrocytosis  without anemia 09/02/2016   Dysphagia 03/10/2016   Olecranon bursitis of right elbow 06/10/2015   Neck mass 12/11/2014   Lumbar degenerative disc disease 04/16/2014   Enteritis due to Clostridium difficile 11/09/2013   Left hamstring muscle strain 08/14/2013   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

## 2019-08-10 NOTE — Patient Instructions (Signed)

## 2019-08-14 ENCOUNTER — Encounter: Payer: Self-pay | Admitting: Physical Therapy

## 2019-08-14 ENCOUNTER — Other Ambulatory Visit: Payer: Self-pay

## 2019-08-14 ENCOUNTER — Ambulatory Visit (INDEPENDENT_AMBULATORY_CARE_PROVIDER_SITE_OTHER): Payer: BC Managed Care – PPO | Admitting: Physical Therapy

## 2019-08-14 DIAGNOSIS — M25551 Pain in right hip: Secondary | ICD-10-CM

## 2019-08-14 DIAGNOSIS — M6281 Muscle weakness (generalized): Secondary | ICD-10-CM | POA: Diagnosis not present

## 2019-08-14 DIAGNOSIS — R2689 Other abnormalities of gait and mobility: Secondary | ICD-10-CM

## 2019-08-14 NOTE — Patient Instructions (Signed)
Access Code: [email or web address]: [email or web address]: 07/12/2021Prepared by: [place]  Seated Isometric Hip Adduction with Ball - 2 x daily - 7 x weekly - 1 sets - 10 reps  Standing Hip Abduction with Counter Support - 1 x daily - 7 x weekly - 1 sets - 10 reps  Standing Hip Extension with Counter Support - 1 x daily - 7 x weekly - 1 sets - 10 reps  Supine Heel Slide with Strap - 1 x daily - 7 x weekly - 1 sets - 10 reps  Sit to Stand without Arm Support - 1 x daily - 7 x weekly - 1 sets - 10 reps  Prone on Elbows Stretch - 1 x daily - 7 x weekly - 1 sets - 3 reps - 15 hold

## 2019-08-14 NOTE — Therapy (Signed)
[place], [place], [address] Phone: [phone]   Fax:  [phone]  Physical Therapy Treatment  Patient Details  Name: Christian Lambert MRN: [id number] Date of Birth: 02-19-1940 Referring Provider (PT): Dorna Leitz, MD   Encounter Date: 08/14/2019   PT End of Session - 08/14/19 1611    Visit Number 3    Number of Visits 12    Date for PT Re-Evaluation 09/15/19    PT Start Time 1518    PT Stop Time 1602    PT Time Calculation (min) 44 min    Activity Tolerance Patient tolerated treatment well           Past Medical History:  Diagnosis Date  . Depression, major, single episode, moderate ([place]) 11/24/2016   11/24/2016 PHQ9 = 21, GAD7 = 12 12/22/2016 PHQ 9 = 16, GAD 7 = 11 01/19/2017 PHQ 9 = 14, GAD 7 = 6 02/18/2017 PHQ 9 = 9, GAD 7 = 2  . Enteritis due to Clostridium difficile 11/09/2013  . GERD (gastroesophageal reflux disease) 03/09/2017  . Hyperlipidemia     Past Surgical History:  Procedure Laterality Date  . broken leg    . PROSTATECTOMY      There were no vitals filed for this visit.   Subjective Assessment - 08/14/19 1526    Subjective Pt reports his Rt hip feels a little more swollen over last few days.  He complains he has pain in hip after exercises, but it calms with rest and ice.   Pt reports pain is waking him in the night.    Currently in Pain? Yes    Pain Score 2     Pain Location Hip    Pain Orientation Right    Pain Descriptors / Indicators Dull;Sore    Aggravating Factors  exercise    Pain Relieving Factors pain medication              [facility] PT Assessment - 08/14/19 0001      Assessment   Medical Diagnosis s/p R anterior THR    Referring Provider (PT) Dorna Leitz, MD    Onset Date/Surgical Date 08/02/19    Hand Dominance Right    Prior Therapy none           [facility] Adult PT Treatment/Exercise - 08/14/19 0001      Knee/Hip Exercises: Stretches   Sports administrator  Right;2 reps;20 seconds   seated, foot under chair     Knee/Hip Exercises: Aerobic   Nustep L5: legs only x 5 min       Knee/Hip Exercises: Standing   Hip Abduction Stengthening;Right;Left;1 set;10 reps    Hip Extension Stengthening;Right;Left;1 set;10 reps    Lateral Step Up Right;1 set;5 reps;Hand Hold: 2;Step Height: 6"    Forward Step Up Right;1 set;10 reps;Hand Hold: 2    Step Down Left;Hand Hold: 2;Step Height: 4"   3 reps   Wall Squat 1 set;10 reps      Knee/Hip Exercises: Seated   Sit to Sand 5 reps;without UE support      Knee/Hip Exercises: Prone   Hip Extension Right;Left;2 sets;5 reps    Hip Extension Limitations challenging    Other [place] Exercises prone on elbows x 15 sec x 3 reps       Manual Therapy   Manual Therapy Taping    Kinesiotex Edema      Kinesiotix   Edema 4 strips of sensitive skin Rock tape applied in  squid shape across Rt posterior thigh to prox calf to assist in edema/ bruising.                        PT Long Term Goals - 08/14/19 1811      PT LONG TERM GOAL #1   Title The patient will be indep with HEP.    Time 6    Period Weeks    Status On-going      PT LONG TERM GOAL #2   Title The patient will reduce functional limitation from 74% to 34% limitation.    Time 6    Period Weeks    Status On-going      PT LONG TERM GOAL #3   Title The patient will ambulate without a device with gait speed improved to > or equal to 2.6 ft/sec (from 0.98 ft/sec).    Time 6    Period Weeks    Status On-going      PT LONG TERM GOAL #4   Title The patient will move sit<>stand and sit<>supine independently without using compensatory strategies.    Time 6    Period Weeks    Status Partially Met      PT LONG TERM GOAL #5   Title The patient will negotiate 12 steps with one handrail and reciprocal pattern mod indep.    Time 6    Period Weeks    Status Partially Met                 Plan - 08/14/19 1807    Clinical Impression  Statement Good response to Rock tape applied to posterior Rt thigh; improved bruising to area. Pt tolerated all exercises well, with only mild increase in pain.  Pt decined ice; will use at home.  Encouraged pt to contact MD about increased swelling and warmth near incision; (incision covered by waterproof bandage).  Pt progressing well towards goals.    Personal Factors and Comorbidities Comorbidity 1    Comorbidities h/o prior lower leg fx R LE (years ago)    Examination-Activity Limitations Bed Mobility;Bathing;Bend;Squat;Stairs;Locomotion Level    Examination-Participation Restrictions Community Activity;Driving    Stability/Clinical Decision Making Stable/Uncomplicated    Rehab Potential Good    PT Frequency 2x / week    PT Duration 6 weeks    PT Treatment/Interventions ADLs/Self Care Home Management;Gait training;Stair training;Functional mobility training;Therapeutic activities;Therapeutic exercise;Neuromuscular re-education;Patient/family education;Taping;Passive range of motion;Manual techniques;Electrical Stimulation;Moist Heat;Cryotherapy    PT Next Visit Plan progress LE strength, HEP, gait and stair negotiation.    PT Home Exercise Plan [email or web address]    Consulted and Agree with Plan of Care Patient           Patient will benefit from skilled therapeutic intervention in order to improve the following deficits and impairments:  Abnormal gait, Pain, Hypomobility, Impaired flexibility, Decreased strength, Decreased range of motion, Decreased balance  Visit Diagnosis: Pain in right hip  Muscle weakness (generalized)  Other abnormalities of gait and mobility     Problem List Patient Active Problem List   Diagnosis Date Noted  . Primary osteoarthritis of right hip 06/21/2019  . Seborrheic keratosis 08/24/2018  . Rhinitis 08/24/2018  . CFS (chronic fatigue syndrome) 10/13/2017  . GERD (gastroesophageal reflux disease) 03/09/2017  . Acne rosacea, erythrotelangiectatic type  02/24/2017  . Depression, major, single episode, moderate ([name]) 11/24/2016  . Macrocytosis without anemia 09/02/2016  . Dysphagia 03/10/2016  . Olecranon bursitis of right elbow 06/10/2015  . Neck  mass 12/11/2014  . Lumbar degenerative disc disease 04/16/2014  . Enteritis due to Clostridium difficile 11/09/2013  . Left hamstring muscle strain 08/14/2013  . 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 Obstructive uropathy post prostatectomy per patient 11/24/2012  . Insomnia 11/24/2012  . Seborrheic dermatitis 11/24/2012  . Annual physical exam 11/24/2012  . Hyperlipidemia 11/24/2012   Kerin Perna, PTA 08/14/19 6:17 PM  [place], [place], [address] Phone: [phone]   Fax:  [phone]  Name: Romelle Reiley MRN: [id number] Date of Birth: 22-Aug-1940

## 2019-08-16 ENCOUNTER — Ambulatory Visit (INDEPENDENT_AMBULATORY_CARE_PROVIDER_SITE_OTHER): Payer: BC Managed Care – PPO | Admitting: Physical Therapy

## 2019-08-16 ENCOUNTER — Encounter: Payer: Self-pay | Admitting: Physical Therapy

## 2019-08-16 ENCOUNTER — Other Ambulatory Visit: Payer: Self-pay

## 2019-08-16 DIAGNOSIS — R2689 Other abnormalities of gait and mobility: Secondary | ICD-10-CM

## 2019-08-16 DIAGNOSIS — M6281 Muscle weakness (generalized): Secondary | ICD-10-CM | POA: Diagnosis not present

## 2019-08-16 DIAGNOSIS — M25551 Pain in right hip: Secondary | ICD-10-CM | POA: Diagnosis not present

## 2019-08-16 NOTE — Therapy (Signed)
[facility] [place], [place], [address] Phone: [phone]   Fax:  [phone]  Physical Therapy Treatment  Patient Details  Name: Christian Lambert MRN: [id number] Date of Birth: 1940-11-05 Referring Provider (PT): Dorna Leitz, MD   Encounter Date: 08/16/2019   PT End of Session - 08/16/19 1601    Visit Number 4    Number of Visits 12    Date for PT Re-Evaluation 09/15/19    PT Start Time 4650    PT Stop Time 1554    PT Time Calculation (min) 38 min    Activity Tolerance Patient tolerated treatment well    Behavior During Therapy [facility] for tasks assessed/performed           Past Medical History:  Diagnosis Date  . Depression, major, single episode, moderate ([place]) 11/24/2016   11/24/2016 PHQ9 = 21, GAD7 = 12 12/22/2016 PHQ 9 = 16, GAD 7 = 11 01/19/2017 PHQ 9 = 14, GAD 7 = 6 02/18/2017 PHQ 9 = 9, GAD 7 = 2  . Enteritis due to Clostridium difficile 11/09/2013  . GERD (gastroesophageal reflux disease) 03/09/2017  . Hyperlipidemia     Past Surgical History:  Procedure Laterality Date  . broken leg    . PROSTATECTOMY      There were no vitals filed for this visit.   Subjective Assessment - 08/16/19 1523    Subjective Pt reports he went to the Ortho surgeon's office yesterday after reporting increased swelling.  He states that they changed the bandage, took xrays and assured him that everything looked good and the swelling was normal.    Currently in Pain? No/denies    Pain Score 0-No pain              [facility] PT Assessment - 08/16/19 0001      Assessment   Medical Diagnosis s/p R anterior THR    Referring Provider (PT) Dorna Leitz, MD    Onset Date/Surgical Date 08/02/19    Hand Dominance Right    Prior Therapy none      Ambulation/Gait   Ambulation Surface Level    Gait velocity 3.28 ft/sec    17 ft - in 5.17 sec           [facility] Adult PT Treatment/Exercise - 08/16/19 0001       Ambulation/Gait   Ambulation/Gait Assistance 7: Independent    Ambulation Distance (Feet) 200 Feet   in between exercises   Assistive device None    Gait Pattern Step-through pattern;Antalgic;Decreased stance time - right;Decreased dorsiflexion - right;Right flexed knee in stance      Knee/Hip Exercises: Stretches   Passive Hamstring Stretch Right;2 reps;30 seconds   supine with strap   Quad Stretch Right;2 reps;30 seconds   prone with strap   Gastroc Stretch Right;2 reps;Left;1 rep;20 seconds   heel off of step     Knee/Hip Exercises: Aerobic   Tread Mill 1.4MPH  x 3 min for warm up. VC for posture      Knee/Hip Exercises: Standing   Hip Extension Stengthening;Right;Left;1 set;10 reps    Lateral Step Up Right;1 set;10 reps;Hand Hold: 2;Step Height: 6"    Stairs reciprocal pattern with single rail x 13 stairs with supervision      Knee/Hip Exercises: Seated   Sit to Sand 5 reps;without UE support      Knee/Hip Exercises: Supine   Quad Sets Strengthening;Right;1 set;5 reps   10 sec   Bridges 1 set;10  reps    Straight Leg Raises AAROM;Right;1 set;5 reps    Other Supine Knee/Hip Exercises supine marching x 10 steps each leg      Knee/Hip Exercises: Prone   Other Prone Exercises prone on elbows x 15 sec x 3 reps                        PT Long Term Goals - 08/16/19 1526      PT LONG TERM GOAL #1   Title The patient will be indep with HEP.    Time 6    Period Weeks    Status On-going      PT LONG TERM GOAL #2   Title The patient will reduce functional limitation from 74% to 34% limitation.    Time 6    Period Weeks    Status On-going      PT LONG TERM GOAL #3   Title The patient will ambulate without a device with gait speed improved to > or equal to 2.6 ft/sec (from 0.98 ft/sec).    Time 6    Period Weeks    Status Achieved      PT LONG TERM GOAL #4   Title The patient will move sit<>stand and sit<>supine independently without using compensatory  strategies.    Time 6    Period Weeks    Status Achieved      PT LONG TERM GOAL #5   Title The patient will negotiate 12 steps with one handrail and reciprocal pattern mod indep.    Time 6    Period Weeks    Status Achieved                 Plan - 08/16/19 1602    Clinical Impression Statement Pt able to tolerate reciprocal pattern with single rail, 13 steps.  Continued Rt ant hip weakness; requires AAROM for SLR. Able to complete supine marching with mild difficulty.  Pt has met LTG #3,4,5 and is making good progress towards remaining goals.    Personal Factors and Comorbidities Comorbidity 1    Comorbidities h/o prior lower leg fx R LE (years ago)    Examination-Activity Limitations Bed Mobility;Bathing;Bend;Squat;Stairs;Locomotion Level    Examination-Participation Restrictions Community Activity;Driving    Stability/Clinical Decision Making Stable/Uncomplicated    Rehab Potential Good    PT Frequency 2x / week    PT Duration 6 weeks    PT Treatment/Interventions ADLs/Self Care Home Management;Gait training;Stair training;Functional mobility training;Therapeutic activities;Therapeutic exercise;Neuromuscular re-education;Patient/family education;Taping;Passive range of motion;Manual techniques;Electrical Stimulation;Moist Heat;Cryotherapy    PT Next Visit Plan progress LE strength, HEP, gait.    PT Home Exercise Plan [id number]    Consulted and Agree with Plan of Care Patient           Patient will benefit from skilled therapeutic intervention in order to improve the following deficits and impairments:  Abnormal gait, Pain, Hypomobility, Impaired flexibility, Decreased strength, Decreased range of motion, Decreased balance  Visit Diagnosis: Pain in right hip  Muscle weakness (generalized)  Other abnormalities of gait and mobility     Problem List Patient Active Problem List   Diagnosis Date Noted  . Primary osteoarthritis of right hip 06/21/2019  . Seborrheic  keratosis 08/24/2018  . Rhinitis 08/24/2018  . CFS (chronic fatigue syndrome) 10/13/2017  . GERD (gastroesophageal reflux disease) 03/09/2017  . Acne rosacea, erythrotelangiectatic type 02/24/2017  . Depression, major, single episode, moderate ([place]) 11/24/2016  . Macrocytosis without anemia 09/02/2016  .  Dysphagia 03/10/2016  . Olecranon bursitis of right elbow 06/10/2015  . Neck mass 12/11/2014  . Lumbar degenerative disc disease 04/16/2014  . Enteritis due to Clostridium difficile 11/09/2013  . Left hamstring muscle strain 08/14/2013  . 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 Obstructive uropathy post prostatectomy per patient 11/24/2012  . Insomnia 11/24/2012  . Seborrheic dermatitis 11/24/2012  . Annual physical exam 11/24/2012  . Hyperlipidemia 11/24/2012   Kerin Perna, PTA 08/16/19 4:09 PM  [place], [place], [address] Phone: [phone]   Fax:  [phone]  Name: Akin Yi MRN: [id number] Date of Birth: 04/01/1940

## 2019-08-18 ENCOUNTER — Other Ambulatory Visit: Payer: Self-pay | Admitting: Sports Medicine

## 2019-08-18 DIAGNOSIS — J00 Acute nasopharyngitis [common cold]: Secondary | ICD-10-CM

## 2019-08-21 ENCOUNTER — Other Ambulatory Visit: Payer: Self-pay

## 2019-08-21 ENCOUNTER — Encounter: Payer: Self-pay | Admitting: Physical Therapy

## 2019-08-21 ENCOUNTER — Ambulatory Visit (INDEPENDENT_AMBULATORY_CARE_PROVIDER_SITE_OTHER): Payer: BC Managed Care – PPO | Admitting: Physical Therapy

## 2019-08-21 DIAGNOSIS — M6281 Muscle weakness (generalized): Secondary | ICD-10-CM | POA: Diagnosis not present

## 2019-08-21 DIAGNOSIS — R2689 Other abnormalities of gait and mobility: Secondary | ICD-10-CM

## 2019-08-21 DIAGNOSIS — M25551 Pain in right hip: Secondary | ICD-10-CM

## 2019-08-21 NOTE — Therapy (Signed)
[place], [place], [address] Phone: [phone]   Fax:  [phone]  Physical Therapy Treatment  Patient Details  Name: Christian Lambert MRN: [id number] Date of Birth: January 02, 1941 Referring Provider (PT): Dorna Leitz, MD   Encounter Date: 08/21/2019   PT End of Session - 08/21/19 1530    Visit Number 5    Number of Visits 12    Date for PT Re-Evaluation 09/15/19    PT Start Time 1519    PT Stop Time 1557    PT Time Calculation (min) 38 min    Activity Tolerance Patient tolerated treatment well    Behavior During Therapy [facility] for tasks assessed/performed           Past Medical History:  Diagnosis Date  . Depression, major, single episode, moderate ([place]) 11/24/2016   11/24/2016 PHQ9 = 21, GAD7 = 12 12/22/2016 PHQ 9 = 16, GAD 7 = 11 01/19/2017 PHQ 9 = 14, GAD 7 = 6 02/18/2017 PHQ 9 = 9, GAD 7 = 2  . Enteritis due to Clostridium difficile 11/09/2013  . GERD (gastroesophageal reflux disease) 03/09/2017  . Hyperlipidemia     Past Surgical History:  Procedure Laterality Date  . broken leg    . PROSTATECTOMY      There were no vitals filed for this visit.   Subjective Assessment - 08/21/19 1530    Subjective Pt reports he saw the surgeon this morning.  Surgeon is pleased with progress; he returns in 3 wks.    Currently in Pain? No/denies    Pain Score 0-No pain              [facility] PT Assessment - 08/21/19 0001      Assessment   Medical Diagnosis s/p R anterior THR    Referring Provider (PT) Dorna Leitz, MD    Onset Date/Surgical Date 08/02/19    Hand Dominance Right    Prior Therapy none           [facility] Adult PT Treatment/Exercise - 08/21/19 0001      Knee/Hip Exercises: Stretches   Sports administrator Right;2 reps;30 seconds   prone with strap   Gastroc Stretch Both;2 reps;30 seconds   incline board     Knee/Hip Exercises: Aerobic   Nustep L5: 4 min       Knee/Hip Exercises: Standing     Side Lunges Right;Left;1 set;10 reps    Hip Abduction Right;Left;1 set;10 reps   3 count out/ 3 count in    Hip Extension Right;Left;1 set;10 reps;Knee straight   3 count out/ 3 count in    SLS Rt SLS x 2 reps of 15 sec with intermittent support to steady    Other Standing Knee Exercises standing to kneeling, then kneeling on mat to laying on back (to simulate getting on under car scooter)    Other Standing Knee Exercises tandem stance x 20 sec x 2 reps with Rt foot in back, 20 sec with Lt foot in back.       Knee/Hip Exercises: Seated   Sit to Sand 10 reps;without UE support      Knee/Hip Exercises: Supine   Heel Slides Strengthening;Right;1 set;10 reps    Bridges 1 set;10 reps   5 sec hold in ext   Other Supine Knee/Hip Exercises supine marching x 10 steps each leg, slow and controlled descent  PT Long Term Goals - 08/16/19 1526      PT LONG TERM GOAL #1   Title The patient will be indep with HEP.    Time 6    Period Weeks    Status On-going      PT LONG TERM GOAL #2   Title The patient will reduce functional limitation from 74% to 34% limitation.    Time 6    Period Weeks    Status On-going      PT LONG TERM GOAL #3   Title The patient will ambulate without a device with gait speed improved to > or equal to 2.6 ft/sec (from 0.98 ft/sec).    Time 6    Period Weeks    Status Achieved      PT LONG TERM GOAL #4   Title The patient will move sit<>stand and sit<>supine independently without using compensatory strategies.    Time 6    Period Weeks    Status Achieved      PT LONG TERM GOAL #5   Title The patient will negotiate 12 steps with one handrail and reciprocal pattern mod indep.    Time 6    Period Weeks    Status Achieved                 Plan - 08/21/19 1544    Clinical Impression Statement Pt standing more upright with less cues for posture.  He continues with some strength deficits of Rt ant hip and decreased Rt LE  balance. Pt tolerating exercise well, without  Increased soreness during session.  Pt making great gains towards remaining goals.    Personal Factors and Comorbidities Comorbidity 1    Comorbidities h/o prior lower leg fx R LE (years ago)    Examination-Activity Limitations Bed Mobility;Bathing;Bend;Squat;Stairs;Locomotion Level    Examination-Participation Restrictions Community Activity;Driving    Stability/Clinical Decision Making Stable/Uncomplicated    Rehab Potential Good    PT Frequency 2x / week    PT Duration 6 weeks    PT Treatment/Interventions ADLs/Self Care Home Management;Gait training;Stair training;Functional mobility training;Therapeutic activities;Therapeutic exercise;Neuromuscular re-education;Patient/family education;Taping;Passive range of motion;Manual techniques;Electrical Stimulation;Moist Heat;Cryotherapy    PT Next Visit Plan progress LE strength, HEP    PT Home Exercise Plan [id number]    Consulted and Agree with Plan of Care Patient           Patient will benefit from skilled therapeutic intervention in order to improve the following deficits and impairments:  Abnormal gait, Pain, Hypomobility, Impaired flexibility, Decreased strength, Decreased range of motion, Decreased balance  Visit Diagnosis: Muscle weakness (generalized)  Pain in right hip  Other abnormalities of gait and mobility     Problem List Patient Active Problem List   Diagnosis Date Noted  . Primary osteoarthritis of right hip 06/21/2019  . Seborrheic keratosis 08/24/2018  . Rhinitis 08/24/2018  . CFS (chronic fatigue syndrome) 10/13/2017  . GERD (gastroesophageal reflux disease) 03/09/2017  . Acne rosacea, erythrotelangiectatic type 02/24/2017  . Depression, major, single episode, moderate ([place]) 11/24/2016  . Macrocytosis without anemia 09/02/2016  . Dysphagia 03/10/2016  . Olecranon bursitis of right elbow 06/10/2015  . Neck mass 12/11/2014  . Lumbar degenerative disc disease  04/16/2014  . Enteritis due to Clostridium difficile 11/09/2013  . Left hamstring muscle strain 08/14/2013  . 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 Obstructive uropathy post prostatectomy per patient 11/24/2012  . Insomnia 11/24/2012  . Seborrheic dermatitis 11/24/2012  . Annual physical exam  11/24/2012  . Hyperlipidemia 11/24/2012   Kerin Perna, PTA 08/21/19 4:02 PM  [place], [place], [address] Phone: [phone]   Fax:  [phone]  Name: Christian Lambert MRN: [id number] Date of Birth: 1940/05/04

## 2019-08-23 ENCOUNTER — Other Ambulatory Visit: Payer: Self-pay

## 2019-08-23 ENCOUNTER — Ambulatory Visit (INDEPENDENT_AMBULATORY_CARE_PROVIDER_SITE_OTHER): Payer: BC Managed Care – PPO | Admitting: Physical Therapy

## 2019-08-23 DIAGNOSIS — M25551 Pain in right hip: Secondary | ICD-10-CM | POA: Diagnosis not present

## 2019-08-23 DIAGNOSIS — R2689 Other abnormalities of gait and mobility: Secondary | ICD-10-CM | POA: Diagnosis not present

## 2019-08-23 DIAGNOSIS — M6281 Muscle weakness (generalized): Secondary | ICD-10-CM | POA: Diagnosis not present

## 2019-08-23 NOTE — Therapy (Addendum)
[place], [place], [address] Phone: [phone]   Fax:  [phone]  Physical Therapy Treatment and Discharge Summary  Patient Details  Name: Christian Lambert MRN: [id number] Date of Birth: 13-Oct-1940 Referring Provider (PT): Dorna Leitz, MD   Encounter Date: 08/23/2019   PT End of Session - 08/23/19 1526    Visit Number 6    Number of Visits 12    Date for PT Re-Evaluation 09/15/19    PT Start Time 1519    PT Stop Time 1556    PT Time Calculation (min) 37 min    Activity Tolerance Patient tolerated treatment well    Behavior During Therapy [facility] for tasks assessed/performed           Past Medical History:  Diagnosis Date  . Depression, major, single episode, moderate ([place]) 11/24/2016   11/24/2016 PHQ9 = 21, GAD7 = 12 12/22/2016 PHQ 9 = 16, GAD 7 = 11 01/19/2017 PHQ 9 = 14, GAD 7 = 6 02/18/2017 PHQ 9 = 9, GAD 7 = 2  . Enteritis due to Clostridium difficile 11/09/2013  . GERD (gastroesophageal reflux disease) 03/09/2017  . Hyperlipidemia     Past Surgical History:  Procedure Laterality Date  . broken leg    . PROSTATECTOMY      There were no vitals filed for this visit.   Subjective Assessment - 08/23/19 1529    Subjective Pt reports he was sore after last session, but he knows it is helping him.  He verbalized readiness to d/c after today's visit. He is pleased with his progress.    Currently in Pain? No/denies    Pain Score 0-No pain              [facility] PT Assessment - 08/23/19 0001      Assessment   Medical Diagnosis s/p R anterior THR    Referring Provider (PT) Dorna Leitz, MD    Onset Date/Surgical Date 08/02/19    Hand Dominance Right    Prior Therapy none      Observation/Other Assessments   Focus on Therapeutic Outcomes (FOTO)  31% limited            [facility] Adult PT Treatment/Exercise - 08/23/19 0001      Knee/Hip Exercises: Aerobic   Nustep L5: 6 min       Knee/Hip  Exercises: Standing   Hip Flexion Stengthening;Right;10 reps;2 sets   high knee marching   Side Lunges Right;Left;1 set;10 reps    Hip Abduction Stengthening;Right;Left;1 set;10 reps    Hip Extension Right;Left;1 set;10 reps;Knee straight   3 count out/ 3 count in    Other Standing Knee Exercises tandem stance x 30 sec x 2 reps (2nd set with small slow head turns) ;  tandem walk forward/ retro x 10 ft x 2 reps     Other Standing Knee Exercises side stepping with increased step height x 10 ft  Rt/Lt      Knee/Hip Exercises: Supine   Heel Slides Strengthening;Right;1 set;15 reps    Bridges 1 set;10 reps   5 sec hold in ext   Straight Leg Raises AAROM;Right;1 set;5 reps            PT Long Term Goals - 08/23/19 1601      PT LONG TERM GOAL #1   Title The patient will be indep with HEP.    Time 6    Period Weeks    Status Achieved  PT LONG TERM GOAL #2   Title The patient will reduce functional limitation from 74% to 34% limitation.    Time 6    Period Weeks    Status Achieved      PT LONG TERM GOAL #3   Title The patient will ambulate without a device with gait speed improved to > or equal to 2.6 ft/sec (from 0.98 ft/sec).    Time 6    Period Weeks    Status Achieved      PT LONG TERM GOAL #4   Title The patient will move sit<>stand and sit<>supine independently without using compensatory strategies.    Time 6    Period Weeks    Status Achieved      PT LONG TERM GOAL #5   Title The patient will negotiate 12 steps with one handrail and reciprocal pattern mod indep.    Time 6    Period Weeks    Status Achieved                 Plan - 08/23/19 1558    Clinical Impression Statement Pt's FOTO score improved to 31%.  He continues with some weakness in Rt ant hip, requiring assistance to complete SLR, but tolerated all exercises without increased pain.  Pt has met all his goals.    Personal Factors and Comorbidities Comorbidity 1    Comorbidities h/o prior lower  leg fx R LE (years ago)    Examination-Activity Limitations Bed Mobility;Bathing;Bend;Squat;Stairs;Locomotion Level    Examination-Participation Restrictions Community Activity;Driving    Stability/Clinical Decision Making Stable/Uncomplicated    Rehab Potential Good    PT Frequency 2x / week    PT Duration 6 weeks    PT Treatment/Interventions ADLs/Self Care Home Management;Gait training;Stair training;Functional mobility training;Therapeutic activities;Therapeutic exercise;Neuromuscular re-education;Patient/family education;Taping;Passive range of motion;Manual techniques;Electrical Stimulation;Moist Heat;Cryotherapy    PT Next Visit Plan will d/c to HEP.    PT Home Exercise Plan [id number]    Consulted and Agree with Plan of Care Patient           Patient will benefit from skilled therapeutic intervention in order to improve the following deficits and impairments:  Abnormal gait, Pain, Hypomobility, Impaired flexibility, Decreased strength, Decreased range of motion, Decreased balance  Visit Diagnosis: Muscle weakness (generalized)  Pain in right hip  Other abnormalities of gait and mobility     Problem List Patient Active Problem List   Diagnosis Date Noted  . Primary osteoarthritis of right hip 06/21/2019  . Seborrheic keratosis 08/24/2018  . Rhinitis 08/24/2018  . CFS (chronic fatigue syndrome) 10/13/2017  . GERD (gastroesophageal reflux disease) 03/09/2017  . Acne rosacea, erythrotelangiectatic type 02/24/2017  . Depression, major, single episode, moderate ([place]) 11/24/2016  . Macrocytosis without anemia 09/02/2016  . Dysphagia 03/10/2016  . Olecranon bursitis of right elbow 06/10/2015  . Neck mass 12/11/2014  . Lumbar degenerative disc disease 04/16/2014  . Enteritis due to Clostridium difficile 11/09/2013  . Left hamstring muscle strain 08/14/2013  . 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 Obstructive uropathy post prostatectomy per patient 11/24/2012   . Insomnia 11/24/2012  . Seborrheic dermatitis 11/24/2012  . Annual physical exam 11/24/2012  . Hyperlipidemia 11/24/2012    PHYSICAL THERAPY DISCHARGE SUMMARY  Visits from Start of Care: 6  Current functional level related to goals / functional outcomes: See goals above.   Remaining deficits: Pleased with progress   Education / Equipment: HEP  Plan: Patient agrees to discharge.  Patient goals were  met. Patient is being discharged due to meeting the stated rehab goals.  ?????      Thank you for the referral of this patient. Rudell Cobb, MPT Kerin Perna, [place] 08/23/19 4:01 PM   [facility] [place], [place], [address] Phone: [phone]   Fax:  [phone]  Name: Christian Lambert MRN: [id number] Date of Birth: May 28, 1940

## 2019-08-28 ENCOUNTER — Encounter: Payer: BC Managed Care – PPO | Admitting: Physical Therapy

## 2019-09-27 ENCOUNTER — Ambulatory Visit: Payer: BC Managed Care – PPO

## 2019-09-27 ENCOUNTER — Other Ambulatory Visit: Payer: Self-pay | Admitting: Sports Medicine

## 2019-09-27 DIAGNOSIS — J00 Acute nasopharyngitis [common cold]: Secondary | ICD-10-CM

## 2019-10-13 ENCOUNTER — Other Ambulatory Visit: Payer: Self-pay | Admitting: Sports Medicine

## 2019-10-13 DIAGNOSIS — F321 Major depressive disorder, single episode, moderate: Secondary | ICD-10-CM

## 2019-10-15 ENCOUNTER — Other Ambulatory Visit: Payer: Self-pay | Admitting: Sports Medicine

## 2019-10-15 DIAGNOSIS — F5101 Primary insomnia: Secondary | ICD-10-CM

## 2020-01-16 ENCOUNTER — Other Ambulatory Visit: Payer: Self-pay | Admitting: Sports Medicine

## 2020-01-17 ENCOUNTER — Telehealth: Payer: Self-pay | Admitting: Sports Medicine

## 2020-01-17 DIAGNOSIS — F321 Major depressive disorder, single episode, moderate: Secondary | ICD-10-CM

## 2020-01-17 MED ORDER — ZOLPIDEM TARTRATE ER 12.5 MG PO TBCR: 12.5000 mg | EXTENDED_RELEASE_TABLET | Freq: Every day | ORAL | 3 refills | Status: DC

## 2020-01-17 NOTE — Telephone Encounter (Signed)
Patient aware prescription was sent to pharmacy as requested.  

## 2020-01-17 NOTE — Telephone Encounter (Signed)
Patient stopped by stating that he needed a refill on medication listed below and that he changed pharmacies to the one listed below. I have updated his preferred pharmacy in [place].(stated that the pharmacy couldn't get in contact with the dr's office?)   zolpidem (AMBIEN CR) 12.5 MG CR tablet    [facility] Address: Kerin Perna [address], [place], [place] [address] Phone: [phone]

## 2020-06-05 ENCOUNTER — Other Ambulatory Visit: Payer: Self-pay | Admitting: Sports Medicine

## 2020-06-05 DIAGNOSIS — F321 Major depressive disorder, single episode, moderate: Secondary | ICD-10-CM

## 2020-06-06 ENCOUNTER — Other Ambulatory Visit: Payer: Self-pay | Admitting: Sports Medicine

## 2020-06-06 DIAGNOSIS — F321 Major depressive disorder, single episode, moderate: Secondary | ICD-10-CM

## 2020-08-14 ENCOUNTER — Other Ambulatory Visit: Payer: Self-pay | Admitting: Sports Medicine

## 2020-08-14 DIAGNOSIS — F321 Major depressive disorder, single episode, moderate: Secondary | ICD-10-CM

## 2020-08-15 NOTE — Telephone Encounter (Signed)
Call pt: needs schedule a follow-up with appointment with Dr. Dianah Field for his chronic medications he has not been seen in over a year and so we will need a follow-up appointment for any future refills.  I only filled a partial prescription.  Meds ordered this encounter  Medications   zolpidem (AMBIEN CR) 12.5 MG CR tablet    Sig: TAKE 1 TABLET BY MOUTH AT BEDTIME    Dispense:  15 tablet    Refill:  0    Pt needs appt. Hasn't been seen in over a year.

## 2020-08-19 ENCOUNTER — Other Ambulatory Visit: Payer: Self-pay | Admitting: Sports Medicine

## 2020-08-19 DIAGNOSIS — F321 Major depressive disorder, single episode, moderate: Secondary | ICD-10-CM

## 2020-08-19 NOTE — Telephone Encounter (Signed)
Called patient 3x and phone rings a few times and then hangs up. Will attempt to call again later. AM

## 2020-08-20 ENCOUNTER — Telehealth: Payer: Self-pay

## 2020-08-20 NOTE — Telephone Encounter (Signed)
Patient left msg stating that he has an appt scheduled for next week and requesting a refill on a medication. Could not understand the name of the medication. Attempted to call the number the patient left in the msg and the number in the system for the patient; unable to reach patient or leave msg at either number.

## 2020-08-20 NOTE — Telephone Encounter (Signed)
Attempted to call again this morning, phone rings 3x and then hangs up. AM

## 2020-08-20 NOTE — Telephone Encounter (Signed)
Called spouse's number listed in chart, left voicemail message for patient to call back to get this appt scheduled. AM

## 2020-08-22 ENCOUNTER — Other Ambulatory Visit: Payer: Self-pay | Admitting: Sports Medicine

## 2020-08-22 DIAGNOSIS — F321 Major depressive disorder, single episode, moderate: Secondary | ICD-10-CM

## 2020-08-23 NOTE — Telephone Encounter (Signed)
Tempted to reach patient using both number again; unable to reach patient or leave a msg.

## 2020-08-27 NOTE — Telephone Encounter (Signed)
Attempted to reach patient on both numbers; unable to reach patient or leave msg at either number.

## 2020-08-28 ENCOUNTER — Ambulatory Visit (INDEPENDENT_AMBULATORY_CARE_PROVIDER_SITE_OTHER): Payer: Medicare Other | Admitting: Sports Medicine

## 2020-08-28 ENCOUNTER — Other Ambulatory Visit: Payer: Self-pay

## 2020-08-28 DIAGNOSIS — E538 Deficiency of other specified B group vitamins: Secondary | ICD-10-CM

## 2020-08-28 DIAGNOSIS — F5101 Primary insomnia: Secondary | ICD-10-CM

## 2020-08-28 DIAGNOSIS — D7589 Other specified diseases of blood and blood-forming organs: Secondary | ICD-10-CM

## 2020-08-28 DIAGNOSIS — L219 Seborrheic dermatitis, unspecified: Secondary | ICD-10-CM

## 2020-08-28 DIAGNOSIS — F321 Major depressive disorder, single episode, moderate: Secondary | ICD-10-CM | POA: Diagnosis not present

## 2020-08-28 DIAGNOSIS — E7800 Pure hypercholesterolemia, unspecified: Secondary | ICD-10-CM

## 2020-08-28 DIAGNOSIS — R1310 Dysphagia, unspecified: Secondary | ICD-10-CM

## 2020-08-28 DIAGNOSIS — H547 Unspecified visual loss: Secondary | ICD-10-CM | POA: Insufficient documentation

## 2020-08-28 DIAGNOSIS — R739 Hyperglycemia, unspecified: Secondary | ICD-10-CM

## 2020-08-28 MED ORDER — GEMFIBROZIL 600 MG PO TABS
600.0000 mg | ORAL_TABLET | Freq: Two times a day (BID) | ORAL | 3 refills | Status: DC
Start: 2020-08-28 — End: 2021-08-26

## 2020-08-28 MED ORDER — ZOLPIDEM TARTRATE ER 12.5 MG PO TBCR: 12.5000 mg | EXTENDED_RELEASE_TABLET | Freq: Every day | ORAL | 3 refills | Status: DC

## 2020-08-28 MED ORDER — TRIAMCINOLONE ACETONIDE 0.1 % EX CREA: 1.0000 "application " | TOPICAL_CREAM | Freq: Every day | CUTANEOUS | 0 refills | Status: DC

## 2020-08-28 NOTE — Assessment & Plan Note (Signed)
Continues to have occasional episodes of dysphagia, he feels like food gets stuck in his upper throat, this occurs with various consistencies including water and solids. I am going to go ahead and get him set up for a modified barium swallow.

## 2020-08-28 NOTE — Telephone Encounter (Signed)
Patient was seen for an OV today and this was addressed at that time.

## 2020-08-28 NOTE — Assessment & Plan Note (Signed)
Stable and well-controlled, refilling zolpidem

## 2020-08-28 NOTE — Progress Notes (Addendum)
    Procedures performed today:    None.  Independent interpretation of notes and tests performed by another provider:   None.  Brief History, Exam, Impression, and Recommendations:    Seborrheic dermatitis Definite seborrheic dermatitis at the nasolabial folds, adding topical low-dose triamcinolone. He also has what appear to be some skin cancers on his ears, I would like him to follow-up with dermatology.  Insomnia Stable and well-controlled, refilling zolpidem  Hyperlipidemia Rechecking labs including lipids.  Dysphagia Continues to have occasional episodes of dysphagia, he feels like food gets stuck in his upper throat, this occurs with various consistencies including water and solids. I am going to go ahead and get him set up for a modified barium swallow.  Poor vision History of phacoemulsification, continued poor vision, referral to optometry downstairs.  Hyperglycemia Incidentally noted, adding hemoglobin A1c.    ___________________________________________ Gwen Her. Dianah Field, M.D., ABFM., CAQSM. Primary Care and [place] Instructor of [place] [employer] [facility] of Medicine

## 2020-08-28 NOTE — Assessment & Plan Note (Signed)
History of phacoemulsification, continued poor vision, referral to optometry downstairs.

## 2020-08-28 NOTE — Assessment & Plan Note (Signed)
Definite seborrheic dermatitis at the nasolabial folds, adding topical low-dose triamcinolone. He also has what appear to be some skin cancers on his ears, I would like him to follow-up with dermatology.

## 2020-08-28 NOTE — Assessment & Plan Note (Signed)
Rechecking labs including lipids.

## 2020-08-29 DIAGNOSIS — R739 Hyperglycemia, unspecified: Secondary | ICD-10-CM | POA: Insufficient documentation

## 2020-08-29 LAB — CBC
HCT: 42.6 % (ref 38.5–50.0)
Hemoglobin: 14.4 g/dL (ref 13.2–17.1)
MCH: 36.5 pg — ABNORMAL HIGH (ref 27.0–33.0)
MCHC: 33.8 g/dL (ref 32.0–36.0)
MCV: 107.8 fL — ABNORMAL HIGH (ref 80.0–100.0)
MPV: 10.6 fL (ref 7.5–12.5)
Platelets: 201 10*3/uL (ref 140–400)
RBC: 3.95 10*6/uL — ABNORMAL LOW (ref 4.20–5.80)
RDW: 15.4 % — ABNORMAL HIGH (ref 11.0–15.0)
WBC: 4.5 10*3/uL (ref 3.8–10.8)

## 2020-08-29 LAB — COMPREHENSIVE METABOLIC PANEL
AG Ratio: 1.8 (calc) (ref 1.0–2.5)
ALT: 11 U/L (ref 9–46)
AST: 17 U/L (ref 10–35)
Albumin: 4.3 g/dL (ref 3.6–5.1)
Alkaline phosphatase (APISO): 64 U/L (ref 35–144)
BUN: 18 mg/dL (ref 7–25)
CO2: 24 mmol/L (ref 20–32)
Calcium: 9 mg/dL (ref 8.6–10.3)
Chloride: 106 mmol/L (ref 98–110)
Creat: 0.95 mg/dL (ref 0.70–1.22)
Globulin: 2.4 g/dL (calc) (ref 1.9–3.7)
Glucose, Bld: 134 mg/dL — ABNORMAL HIGH (ref 65–99)
Potassium: 4.2 mmol/L (ref 3.5–5.3)
Sodium: 140 mmol/L (ref 135–146)
Total Bilirubin: 0.7 mg/dL (ref 0.2–1.2)
Total Protein: 6.7 g/dL (ref 6.1–8.1)

## 2020-08-29 LAB — TSH: TSH: 1.48 mIU/L (ref 0.40–4.50)

## 2020-08-29 LAB — LIPID PANEL
Cholesterol: 136 mg/dL (ref <200)
HDL: 62 mg/dL (ref >=40)
LDL Cholesterol (Calc): 53 mg/dL (calc)
Non-HDL Cholesterol (Calc): 74 mg/dL (calc) (ref <130)
Total CHOL/HDL Ratio: 2.2 (calc) (ref <5.0)
Triglycerides: 131 mg/dL (ref <150)

## 2020-08-29 LAB — VITAMIN B12: Vitamin B-12: 478 pg/mL (ref 200–1100)

## 2020-08-29 NOTE — Assessment & Plan Note (Signed)
Incidentally noted, adding hemoglobin A1c.

## 2020-08-29 NOTE — Addendum Note (Signed)
Addended by: Silverio Decamp on: 08/29/2020 08:34 AM   Modules accepted: Orders

## 2020-08-30 ENCOUNTER — Other Ambulatory Visit (HOSPITAL_COMMUNITY): Payer: Self-pay

## 2020-08-30 DIAGNOSIS — R059 Cough, unspecified: Secondary | ICD-10-CM

## 2020-08-30 DIAGNOSIS — R1310 Dysphagia, unspecified: Secondary | ICD-10-CM

## 2020-09-04 ENCOUNTER — Encounter (HOSPITAL_COMMUNITY): Payer: Medicare Other

## 2020-09-04 ENCOUNTER — Ambulatory Visit (HOSPITAL_COMMUNITY): Payer: Medicare Other

## 2020-09-04 ENCOUNTER — Telehealth (HOSPITAL_COMMUNITY): Payer: Self-pay

## 2020-09-04 NOTE — Telephone Encounter (Signed)
Attempted to contact patient to reschedule OP MBS - voicemail has not been set up.

## 2020-09-09 ENCOUNTER — Ambulatory Visit (INDEPENDENT_AMBULATORY_CARE_PROVIDER_SITE_OTHER): Payer: Medicare Other | Admitting: Family Medicine

## 2020-09-09 ENCOUNTER — Other Ambulatory Visit: Payer: Self-pay

## 2020-09-09 VITALS — BP 122/53 | HR 66

## 2020-09-09 DIAGNOSIS — R739 Hyperglycemia, unspecified: Secondary | ICD-10-CM

## 2020-09-09 LAB — POCT GLYCOSYLATED HEMOGLOBIN (HGB A1C): Hemoglobin A1C: 4.8 % (ref 4.0–5.6)

## 2020-09-09 NOTE — Progress Notes (Signed)
Pt presents today for a HbgA1C check as ordered by Dr. Darene Lamer. Pt had labs drawn on 08/28/20 that resulted a blood glucose of 134. Pt states he was not fasting the day he had his labs drawn.   HA: No Dizziness/lightheadedness: No Fever: No BA: No Weakness/Fatigue: No  Sinus pain/pressure: No  Runny nose: No  ST: No  ShOB: No  CP: No  Palps: No Abd pain: No Dysuria: No  N/V/C/D: No   A1C resulted at 4.8%  This was shared with Dr. Zigmund Daniel who is covering for Dr. Darene Lamer today. He instructed me to let the pt know that there is no evidence of diabetes or pre-diabetes. Pt aware. No further questions or concerns at this time.

## 2020-09-12 ENCOUNTER — Telehealth (HOSPITAL_COMMUNITY): Payer: Self-pay

## 2020-09-12 NOTE — Telephone Encounter (Signed)
2nd attempt to contact patient to reschedule OP MBS - could not leave voicemail.

## 2020-09-20 ENCOUNTER — Telehealth (HOSPITAL_COMMUNITY): Payer: Self-pay

## 2020-09-20 NOTE — Telephone Encounter (Signed)
Spoke with patient to reschedule OP MBS - patient stated he does not want to reschedule at this time. He would like to see an ENT before proceeding. Will close order at this time.

## 2020-09-25 ENCOUNTER — Ambulatory Visit: Payer: Medicare Other | Admitting: Sports Medicine

## 2020-10-08 ENCOUNTER — Ambulatory Visit (INDEPENDENT_AMBULATORY_CARE_PROVIDER_SITE_OTHER): Payer: Medicare Other | Admitting: Physician Assistant

## 2020-10-08 ENCOUNTER — Encounter: Payer: Self-pay | Admitting: Physician Assistant

## 2020-10-08 VITALS — BP 138/61 | HR 70 | Ht 73.0 in | Wt 174.0 lb

## 2020-10-08 DIAGNOSIS — H1013 Acute atopic conjunctivitis, bilateral: Secondary | ICD-10-CM | POA: Diagnosis not present

## 2020-10-08 MED ORDER — AZELASTINE HCL 0.05 % OP SOLN: 2.0000 [drp] | Freq: Two times a day (BID) | OPHTHALMIC | 1 refills | Status: DC

## 2020-10-08 MED ORDER — METHYLPREDNISOLONE SODIUM SUCC 125 MG IJ SOLR
125.0000 mg | Freq: Once | INTRAMUSCULAR | Status: AC
Start: 2020-10-08 — End: 2020-10-08
  Administered 2020-10-08: 125 mg via INTRAMUSCULAR

## 2020-10-08 MED ORDER — LEVOCETIRIZINE DIHYDROCHLORIDE 5 MG PO TABS: 5.0000 mg | ORAL_TABLET | Freq: Every evening | ORAL | 0 refills | Status: DC

## 2020-10-08 NOTE — Patient Instructions (Addendum)
Start xyzal daily for next 2-3 weeks.  Start optivar drops 2 drops twice a day.  Continue cool compresses.  Follow up if not improving in next few days.   Allergic Conjunctivitis, Adult Allergic conjunctivitis is inflammation of the conjunctiva. The conjunctiva is the thin, clear membrane that covers the white part of the eye and the inner surface of the eyelid. In this condition: The blood vessels in the conjunctiva become irritated and swell. The eyes become red or pink and feel itchy. Allergic conjunctivitis cannot be spread from person to person. This condition can develop at any age and may be outgrown. What are the causes? This condition is caused by allergens. These are things that can cause an allergic reaction in some people but not in other people. Common allergens include: Outdoor allergens, such as: Pollen, including pollen from grass and weeds. Mold spores. Car fumes. Indoor allergens, such as: Dust. Smoke. Mold spores. Proteins in a pet's urine, saliva, or dander. What increases the risk? You may be more likely to develop this condition if you have a family history of these things: Allergies. Conditions caused by being exposed to allergens, such as: Allergic rhinitis. This is an allergic reaction that affects the nose. Bronchial asthma. This condition affects the large airways in the lungs and makes breathing difficult. Atopic dermatitis (eczema). This is inflammation of the skin that is long-term (chronic). What are the signs or symptoms? Symptoms of this condition include eyes that are: Itchy. Red. Watery. Puffy. Your eyes may also: Sting or burn. Have clear fluid draining from them. Have thick mucus discharge and pain (vernal conjunctivitis). How is this diagnosed? This condition may be diagnosed by: Your medical history. A physical exam. Tests of the fluid draining from your eyes to rule out other causes. Other tests to confirm the diagnosis,  including: Testing for allergies. The skin may be pricked with a tiny needle. The pricked area is then exposed to small amounts of allergens. Testing for other eye conditions. Tests may include: Blood tests. Tissue scrapings from your eyelid. The tissue is then checked under a microscope. How is this treated? This condition may be treated with: Cold, wet cloths (cold compresses) to soothe itching and swelling. Washing the face to remove allergens. Eye drops. These may be prescription or over-the-counter. You may need to try different types to see which one works best for you, such as: Eye drops that block the allergic reaction (antihistamine). Eye drops that reduce swelling and irritation (anti-inflammatory). Steroid eye drops, which may be given if other treatments have not worked (vernal conjunctivitis). Oral antihistamine medicines. These are medicines taken by mouth to lessen your allergic reaction. You may need these if eye drops do not help or are difficult to use. Follow these instructions at home: Eye care Apply a clean, cold compress to your eyes for 10-20 minutes, 3-4 times a day. Do not touch or rub your eyes. Do not wear contact lenses until the inflammation is gone. Wear glasses instead. Do not wear eye makeup until the inflammation is gone. General instructions Avoid known allergens whenever possible. Take or apply over-the-counter and prescription medicines only as told by your health care provider. These include any eye drops. Drink enough fluid to keep your urine pale yellow. Keep all follow-up visits as told by your health care provider. This is important. Contact a health care provider if: Your symptoms get worse or do not get better with treatment. You have mild eye pain. You become sensitive to light.  You have spots or blisters on your eyes. You have pus draining from your eyes. You have a fever. Get help right away if: You have redness, swelling, or other  symptoms in only one eye. Your vision is blurred or you have other vision changes. You have severe eye pain. Summary Allergic conjunctivitis is inflammation of the clear membrane that covers the white part of the eye and the inner surface of the eyelid. Take or apply over-the-counter and prescription medicines only as told by your health care provider. These include eye drops. Do not touch or rub your eyes. Contact a health care provider if your symptoms get worse or do not get better with treatment. This information is not intended to replace advice given to you by your health care provider. Make sure you discuss any questions you have with your health care provider. Document Revised: 12/12/2018 Document Reviewed: 12/12/2018 Elsevier Patient Education  2022 [employer].

## 2020-10-08 NOTE — Progress Notes (Signed)
   Subjective:    Patient ID: Christian Lambert, male    DOB: 23-Apr-1940, 80 y.o.   MRN: [id number]  HPI Pt is a 80 yo male who presents to the clinic with bilateral watery, itchy, burning eyes for the last week. He admits this occurred after cleaning out his garage. He used pink eye relief with no benefit. This happened about a month ago after getting some chemicals in his eyes. It went away on its on. His vision is blurry. No fever, chills, sinus pressure, cough. He does have a scratchy throat. Eyes are matted shut this morning with more clear white discharge.   .. 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   GERD (gastroesophageal reflux disease) 03/09/2017   CFS (chronic fatigue syndrome) 10/13/2017   Primary osteoarthritis of right hip 06/21/2019   Poor vision 08/28/2020   Hyperglycemia 08/29/2020   Resolved Ambulatory Problems    Diagnosis Date Noted   Acute maxillary sinusitis 03/06/2013   Left hamstring muscle strain 08/14/2013   Enteritis due to Clostridium difficile 11/09/2013   Neck mass 12/11/2014   Olecranon bursitis of right elbow 06/10/2015   Nodule of tongue 03/10/2016   Contact dermatitis 11/24/2016   Forgetfulness 01/19/2017   Ganglion cyst of finger 03/09/2017   Immunity status testing 05/28/2017   Weight loss 06/17/2017   Skin lesion of left arm 10/13/2017   Cat bite 11/01/2017   Seborrheic keratosis 08/24/2018   Rhinitis 08/24/2018   No Additional Past Medical History      Review of  Systems See HPI.     Objective:   Physical Exam  Bilateral erythematous conjunctiva with watery discharge and bilateral lower eyelid ectropion. Surrounding skin around eye erythematous and dry and scaly.       Assessment & Plan:  .Marland KitchenJohn was seen today for conjunctivitis.  Diagnoses and all orders for this visit:  Allergic conjunctivitis of both eyes -     azelastine (OPTIVAR) 0.05 % ophthalmic solution; Place 2 drops into both eyes 2 (two) times daily. -     levocetirizine (XYZAL) 5 MG tablet; Take 1 tablet (5 mg total) by mouth every evening. -     methylPREDNISolone sodium succinate (SOLU-MEDROL) 125 mg/2 mL injection 125 mg  Seems more allergic than bacterial or viral.  HO given.  Cool compresses.  Start optivar and xzyal.  Solumedrol given in office today.  Follow up as needed or if symptoms persist or worsen.

## 2020-10-22 ENCOUNTER — Other Ambulatory Visit: Payer: Self-pay

## 2020-10-22 ENCOUNTER — Ambulatory Visit (INDEPENDENT_AMBULATORY_CARE_PROVIDER_SITE_OTHER): Payer: Medicare Other | Admitting: Physician Assistant

## 2020-10-22 ENCOUNTER — Encounter: Payer: Self-pay | Admitting: Physician Assistant

## 2020-10-22 VITALS — BP 137/68 | HR 63 | Ht 73.0 in | Wt 176.0 lb

## 2020-10-22 DIAGNOSIS — H02132 Senile ectropion of right lower eyelid: Secondary | ICD-10-CM | POA: Diagnosis not present

## 2020-10-22 DIAGNOSIS — H01005 Unspecified blepharitis left lower eyelid: Secondary | ICD-10-CM | POA: Diagnosis not present

## 2020-10-22 DIAGNOSIS — H01002 Unspecified blepharitis right lower eyelid: Secondary | ICD-10-CM

## 2020-10-22 DIAGNOSIS — H538 Other visual disturbances: Secondary | ICD-10-CM | POA: Insufficient documentation

## 2020-10-22 DIAGNOSIS — H02135 Senile ectropion of left lower eyelid: Secondary | ICD-10-CM

## 2020-10-22 MED ORDER — POLYMYXIN B-TRIMETHOPRIM 10000-0.1 UNIT/ML-% OP SOLN: 1.0000 [drp] | OPHTHALMIC | 0 refills | Status: DC

## 2020-10-22 NOTE — Progress Notes (Signed)
Subjective:    Patient ID: Christian Lambert, male    DOB: 07-27-1940, 80 y.o.   MRN: [id number]  HPI Pt is a 80 yo male who presents to the clinic with bilateral watery, itchy, blurry eyes. He was seen on 10/08/2020 after he was cleaning out garage and got some chemicals and dust in eyes. He was given optivar drops. They do help when he does them but then eyes get bad again. Vision blurriness comes and goes with watery discharge coming from eyes. Cool compresses feel the best. No fever, chills, body aches.   He has a optometrist but not opthamologist appt scheduled this month.  .. 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   GERD (gastroesophageal reflux disease) 03/09/2017   CFS (chronic fatigue syndrome) 10/13/2017   Primary osteoarthritis of right hip 06/21/2019   Poor vision 08/28/2020   Hyperglycemia 08/29/2020   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 10/22/2020   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   Blurry vision, bilateral 10/22/2020   Resolved Ambulatory Problems    Diagnosis Date Noted   Acute maxillary sinusitis 03/06/2013   Left hamstring muscle strain 08/14/2013   Enteritis due to Clostridium difficile 11/09/2013   Neck mass 12/11/2014   Olecranon bursitis of right elbow 06/10/2015   Nodule of tongue 03/10/2016   Contact dermatitis 11/24/2016   Forgetfulness 01/19/2017   Ganglion cyst of finger 03/09/2017   Immunity status testing 05/28/2017   Weight loss 06/17/2017   Skin lesion of  left arm 10/13/2017   Cat bite 11/01/2017   Seborrheic keratosis 08/24/2018   Rhinitis 08/24/2018   No Additional Past Medical History      Review of Systems See HPI.     Objective:   Physical Exam Vitals reviewed.  Constitutional:      Appearance: Normal appearance.  HENT:     Head: Normocephalic.     Right Ear: Tympanic membrane normal.     Left Ear: Tympanic membrane normal.     Nose: Nose normal.     Mouth/Throat:     Mouth: Mucous membranes are moist.     Pharynx: No oropharyngeal exudate.  Eyes:     General:        Right eye: Discharge present.        Left eye: Discharge present.    Extraocular Movements: Extraocular movements intact.     Pupils: Pupils are equal, round, and reactive to light.     Comments: Slightly injected bilateral conjunctiva with very erythematous lower eyelids that are swollen and turning outward. Watery discharge present.   Cardiovascular:     Rate and Rhythm: Normal rate and regular rhythm.     Pulses: Normal pulses.     Heart sounds: Normal heart sounds.  Pulmonary:     Effort: Pulmonary effort is normal.     Breath sounds: Normal breath sounds.  Neurological:     General: No focal deficit present.     Mental Status: He is alert and oriented to person, place, and time.  Psychiatric:  Mood and Affect: Mood normal.          Assessment & Plan:  .Marland KitchenJohn was seen today for eye problem.  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 For 10 days. -     Ambulatory referral to Ophthalmology  Senile ectropion of both lower eyelids -     Ambulatory referral to Ophthalmology  Blurry vision, bilateral -     trimethoprim-polymyxin b (POLYTRIM) ophthalmic solution; Place 1 drop into both eyes every 4 (four) hours. For 10 days. -     Ambulatory referral to  Ophthalmology  It seems that it is more of the eyelid that is inflamed/infected. Started abx drops today. Vision blurriness resolves with lubricating drops for a little bit which is reassuring.  I do want him to see ophthalmology. Referral placed.  Call with any sustained vision changes or worsening symptoms.

## 2020-10-22 NOTE — Patient Instructions (Signed)
Blepharitis Blepharitis refers to inflammation of the eyelids. It is a common condition and can cause dryness or grittiness in the eyes. Other symptoms may include: Reddish, scaly skin around the scalp and eyebrows. Burning or itching of the eyelids. Eye discharge at night that causes the eyelashes to stick together in the morning. Eyelashes that fall out. Redness of the eyes. Sensitivity to light. Follow these instructions at home: Pay attention to any changes in how your eyes look or feel. Tell your health care provider about any changes. Follow these instructions to help with your condition. Keeping clean Wash your hands often with soap and water for at least 20 seconds. Clean your eyes and wash the edges of your eyelids with diluted baby shampoo or commercial eyelid wipes. Do this 2 or more times a day. Wash your face and eyebrows at least once a day. Use a clean towel each time you dry your eyelids. Do not use this towel to clean or dry other areas of your body. Do not share your towel with anyone. General instructions Avoid wearing makeup until you get better. Do not share makeup with anyone. Avoid rubbing your eyes. Use warm compresses on the eyes for 5-10 minutes. Do this 1 or 2 times a day, or as told by your health care provider. You can use warm water on a towel, but a microwaveable heating pad often stays warm longer. The pad should be very warm but not hot enough to burn the skin. If you were prescribed an antibiotic ointment or steroid drops, apply or use the medicine as told by your health care provider. Do not stop using the medicine even if you feel better. Keep all follow-up visits. This is important. Contact a health care provider if: Your eyelids feel hot. You have blisters or a rash on your eyelids. The inflammation gets worse or does not go away in 2-4 days. Get help right away if: You have pain or redness that gets worse or spreads to other parts of your face. Your  vision changes. You have pain when looking at lights or moving objects. You have a fever. Summary Blepharitis refers to inflammation of the eyelids. It can cause dryness and grittiness in the eyes. Pay attention to any changes in how your eyes look or feel. Tell your health care provider about any changes. Follow home care instructions as told by your health care provider. Wash your hands often with soap and water for at least 20 seconds. Avoid wearing makeup. Do not rub your eyes. If you were prescribed an antibiotic ointment or steroid drops, apply or use the medicine as told by your health care provider. Get help right away if you have a fever, vision changes, pain or redness that gets worse or spreads to other parts of your face, or pain when looking at lights or moving objects. This information is not intended to replace advice given to you by your health care provider. Make sure you discuss any questions you have with your health care provider. Document Revised: 02/21/2020 Document Reviewed: 02/21/2020 Elsevier Patient Education  2022 [employer].

## 2021-01-01 ENCOUNTER — Other Ambulatory Visit: Payer: Self-pay | Admitting: Sports Medicine

## 2021-01-01 DIAGNOSIS — F321 Major depressive disorder, single episode, moderate: Secondary | ICD-10-CM

## 2021-01-02 ENCOUNTER — Other Ambulatory Visit: Payer: Self-pay | Admitting: Sports Medicine

## 2021-01-02 DIAGNOSIS — F321 Major depressive disorder, single episode, moderate: Secondary | ICD-10-CM

## 2021-01-20 ENCOUNTER — Ambulatory Visit (INDEPENDENT_AMBULATORY_CARE_PROVIDER_SITE_OTHER): Payer: Medicare Other | Admitting: Sports Medicine

## 2021-01-20 DIAGNOSIS — Z0000 Encounter for general adult medical examination without abnormal findings: Secondary | ICD-10-CM | POA: Diagnosis not present

## 2021-01-20 NOTE — Patient Instructions (Addendum)
[facility] Maintenance Summary and Written Plan of Care  Mr. Christian Lambert ,  Thank you for allowing me to perform your Medicare Annual Wellness Visit and for your ongoing commitment to your health.   Health Maintenance & Immunization History Health Maintenance  Topic Date Due   TETANUS/TDAP  10/21/2029   Pneumonia Vaccine 81+  Years old  Completed   INFLUENZA VACCINE  Completed   COVID-19 Vaccine  Completed   Zoster Vaccines- Shingrix  Completed   HPV VACCINES  Aged Out   Immunization History  Administered Date(s) Administered   Fluad Quad(high Dose 81+) 10/05/2018   Influenza, High Dose Seasonal PF 11/26/2016   Influenza,inj,Quad PF,6+ Mos 11/24/2012, 11/09/2013, 12/11/2014, 12/05/2015, 10/13/2017   Influenza-Unspecified 10/22/2019, 10/21/2020   Moderna Sars-Covid-2 Vaccination 09/26/2019, 02/01/2020, 08/01/2020   Pfizer Covid-19 Vaccine Bivalent Booster 38yrs & up 10/21/2020   Pneumococcal Conjugate-13 12/11/2014   Pneumococcal Polysaccharide-23 11/24/2012   Tdap 10/16/2010, 11/24/2012, 10/22/2019   Varicella Zoster Immune Globulin 11/24/2012   Zoster Recombinat (Shingrix) 11/12/2018, 01/10/2019    These are the patient goals that we discussed:  Goals Addressed               This Visit's Progress     Patient Stated (pt-stated)        Patient states that he would like to loose 10 lbs.         This is a list of Health Maintenance Items that are overdue or due now: Influenza vaccine    Orders/Referrals Placed Today: No orders of the defined types were placed in this encounter.  (Contact our referral department at [phone] if you have not spoken with someone about your referral appointment within the next 5 days)    Follow-up Plan Follow-up with Silverio Decamp, MD as planned Medicare wellness visit in one year. AVS printed and mailed to the patient.      Health Maintenance, Male Adopting a healthy lifestyle and  getting preventive care are important in promoting health and wellness. Ask your health care provider about: The right schedule for you to have regular tests and exams. Things you can do on your own to prevent diseases and keep yourself healthy. What should I know about diet, weight, and exercise? Eat a healthy diet  Eat a diet that includes plenty of vegetables, fruits, low-fat dairy products, and lean protein. Do not eat a lot of foods that are high in solid fats, added sugars, or sodium. Maintain a healthy weight Body mass index (BMI) is a measurement that can be used to identify possible weight problems. It estimates body fat based on height and weight. Your health care provider can help determine your BMI and help you achieve or maintain a healthy weight. Get regular exercise Get regular exercise. This is one of the most important things you can do for your health. Most adults should: Exercise for at least 150 minutes each week. The exercise should increase your heart rate and make you sweat (moderate-intensity exercise). Do strengthening exercises at least twice a week. This is in addition to the moderate-intensity exercise. Spend less time sitting. Even light physical activity can be beneficial. Watch cholesterol and blood lipids Have your blood tested for lipids and cholesterol at 81 years of age, then have this test every 5 years. You may need to have your cholesterol levels checked more often if: Your lipid or cholesterol levels are high. You are older than 81 years of age. You are at high risk for heart  disease. What should I know about cancer screening? Many types of cancers can be detected early and may often be prevented. Depending on your health history and family history, you may need to have cancer screening at various ages. This may include screening for: Colorectal cancer. Prostate cancer. Skin cancer. Lung cancer. What should I know about heart disease, diabetes, and  high blood pressure? Blood pressure and heart disease High blood pressure causes heart disease and increases the risk of stroke. This is more likely to develop in people who have high blood pressure readings or are overweight. Talk with your health care provider about your target blood pressure readings. Have your blood pressure checked: Every 3-5 years if you are 46-18 years of age. Every year if you are 29 years old or older. If you are between the ages of 51 and 38 and are a current or former smoker, ask your health care provider if you should have a one-time screening for abdominal aortic aneurysm (AAA). Diabetes Have regular diabetes screenings. This checks your fasting blood sugar level. Have the screening done: Once every three years after age 75 if you are at a normal weight and have a low risk for diabetes. More often and at a younger age if you are overweight or have a high risk for diabetes. What should I know about preventing infection? Hepatitis B If you have a higher risk for hepatitis B, you should be screened for this virus. Talk with your health care provider to find out if you are at risk for hepatitis B infection. Hepatitis C Blood testing is recommended for: Everyone born from [birth year] through 1966. Anyone with known risk factors for hepatitis C. Sexually transmitted infections (STIs) You should be screened each year for STIs, including gonorrhea and chlamydia, if: You are sexually active and are younger than 81 years of age. You are older than 81 years of age and your health care provider tells you that you are at risk for this type of infection. Your sexual activity has changed since you were last screened, and you are at increased risk for chlamydia or gonorrhea. Ask your health care provider if you are at risk. Ask your health care provider about whether you are at high risk for HIV. Your health care provider may recommend a prescription medicine to help prevent HIV  infection. If you choose to take medicine to prevent HIV, you should first get tested for HIV. You should then be tested every 3 months for as long as you are taking the medicine. Follow these instructions at home: Alcohol use Do not drink alcohol if your health care provider tells you not to drink. If you drink alcohol: Limit how much you have to 0-2 drinks a day. Know how much alcohol is in your drink. In the [place] one drink equals one 12 oz bottle of beer (355 mL), one 5 oz glass of wine (148 mL), or one 1 oz glass of hard liquor (44 mL). Lifestyle Do not use any products that contain nicotine or tobacco. These products include cigarettes, chewing tobacco, and vaping devices, such as e-cigarettes. If you need help quitting, ask your health care provider. Do not use street drugs. Do not share needles. Ask your health care provider for help if you need support or information about quitting drugs. General instructions Schedule regular health, dental, and eye exams. Stay current with your vaccines. Tell your health care provider if: You often feel depressed. You have ever been abused or do not  feel safe at home. Summary Adopting a healthy lifestyle and getting preventive care are important in promoting health and wellness. Follow your health care provider's instructions about healthy diet, exercising, and getting tested or screened for diseases. Follow your health care provider's instructions on monitoring your cholesterol and blood pressure. This information is not intended to replace advice given to you by your health care provider. Make sure you discuss any questions you have with your health care provider. Document Revised: 06/10/2020 Document Reviewed: 06/10/2020 Elsevier Patient Education  [employer].

## 2021-01-20 NOTE — Progress Notes (Signed)
MEDICARE ANNUAL WELLNESS VISIT  01/20/2021  Telephone Visit Disclaimer This Medicare AWV was conducted by telephone due to national recommendations for restrictions regarding the COVID-19 Pandemic (e.g. social distancing).  I verified, using two identifiers, that I am speaking with Christian Lambert or their authorized healthcare agent. I discussed the limitations, risks, security, and privacy concerns of performing an evaluation and management service by telephone and the potential availability of an in-person appointment in the future. The patient expressed understanding and agreed to proceed.  Location of Patient: Home Location of Provider (nurse): In the office.  Subjective:    Christian Lambert is a 80 y.o. male patient of Thekkekandam, Gwen Her, MD who had a Medicare Annual Wellness Visit today via telephone. Christian Lambert is Retired and lives with their spouse. he has 3 children. he reports that he is socially active and does interact with friends/family regularly. he is minimally physically active and enjoys doing yard work and doing Art therapist around the house.  Patient Care Team: Silverio Decamp, MD as PCP - General (Family Medicine)  Advanced Directives 01/20/2021 09/20/2018 04/23/2014  Does Patient Have a Medical Advance Directive? Yes Yes No  Type of Advance Directive Living will;Healthcare Power of [place];Living will -  Does patient want to make changes to medical advance directive? No - Patient declined No - Patient declined -  Copy of [place] in Chart? No - copy requested No - copy requested -  Would patient like information on creating a medical advance directive? - - Yes - Educational materials given    Hospital Utilization Over the Past 12 Months: # of hospitalizations or ER visits: 0 # of surgeries: 0  Review of Systems    Patient reports that his overall health is unchanged compared to last year.  History obtained from  chart review and the patient  Patient Reported Readings (BP, Pulse, CBG, Weight, etc) none  Pain Assessment Pain : No/denies pain     Current Medications & Allergies (verified) Allergies as of 01/20/2021       Reactions   Imodium A-d [loperamide Hcl] Anaphylaxis   Brimonidine         Medication List        Accurate as of January 20, 2021  3:32 PM. If you have any questions, ask your nurse or doctor.          azelastine 0.05 % ophthalmic solution Commonly known as: OPTIVAR Place 2 drops into both eyes 2 (two) times daily.   gemfibrozil 600 MG tablet Commonly known as: LOPID Take 1 tablet (600 mg total) by mouth 2 (two) times daily with a meal.   levocetirizine 5 MG tablet Commonly known as: XYZAL Take 1 tablet (5 mg total) by mouth every evening.   timolol 0.5 % ophthalmic solution Commonly known as: TIMOPTIC 1 drop every morning.   triamcinolone cream 0.1 % Commonly known as: KENALOG Apply 1 application topically daily.   trimethoprim-polymyxin b ophthalmic solution Commonly known as: Polytrim Place 1 drop into both eyes every 4 (four) hours. For 10 days.   zolpidem 12.5 MG CR tablet Commonly known as: AMBIEN CR TAKE 1 TABLET BY MOUTH AT BEDTIME        History (reviewed): Past Medical History:  Diagnosis Date   Depression, major, single episode, moderate ([place]) 11/24/2016   11/24/2016 PHQ9 = 21, GAD7 = 12 12/22/2016 PHQ 9 = 16, GAD 7 = 11 01/19/2017 PHQ 9 = 14, GAD 7 = 6  02/18/2017 PHQ 9 = 9, GAD 7 = 2   Enteritis due to Clostridium difficile 11/09/2013   GERD (gastroesophageal reflux disease) 03/09/2017   Hyperlipidemia    Past Surgical History:  Procedure Laterality Date   broken leg     PROSTATECTOMY     Family History  Problem Relation Age of Onset   Alcohol abuse Mother    Cancer Mother    Depression Mother    Alcohol abuse Father    Cancer Maternal Aunt    Alcohol abuse Maternal Uncle    Cancer Maternal Uncle    Diabetes Maternal  Grandmother    Social History   Socioeconomic History   Marital status: Married    Spouse name: Angela Nevin   Number of children: 3   Years of education: 10   Highest education level: 10th grade  Occupational History   Occupation: truck Geophysicist/field seismologist    Comment: retired  Tobacco Use   Smoking status: Former   Smokeless tobacco: Never  Scientific laboratory technician Use: Never used  Substance and Sexual Activity   Alcohol use: Yes    Alcohol/week: 3.0 standard drinks    Types: 3 Cans of beer per week    Comment: 12 oz daily   Drug use: Never   Sexual activity: Not Currently  Other Topics Concern   Not on file  Social History Narrative   Lives with his wife. He has three children. He enjoys doing work in the yard and doing stuff around the house.   Social Determinants of Health   Financial Resource Strain: Low Risk    Difficulty of Paying Living Expenses: Not hard at all  Food Insecurity: No Food Insecurity   Worried About Charity fundraiser in the Last Year: Never true   [place] in the Last Year: Never true  Transportation Needs: No Transportation Needs   Lack of Transportation (Medical): No   Lack of Transportation (Non-Medical): No  Physical Activity: Inactive   Days of Exercise per Week: 0 days   Minutes of Exercise per Session: 0 min  Stress: No Stress Concern Present   Feeling of Stress : Not at all  Social Connections: Moderately Isolated   Frequency of Communication with Friends and Family: More than three times a week   Frequency of Social Gatherings with Friends and Family: Never   Attends Religious Services: Never   Marine scientist or Organizations: No   Attends Archivist Meetings: Never   Marital Status: Married    Activities of Daily Living In your present state of health, do you have any difficulty performing the following activities: 01/20/2021  Hearing? Y  Comment feels like he has hearing loss in both ears. plans to go to get it checked.   Vision? 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  Difficulty concentrating or making decisions? Y  Comment He has had memory problem all of his life due to his ADHD.  Walking or climbing stairs? N  Dressing or bathing? N  Doing errands, shopping? N  Preparing Food and eating ? N  Using the Toilet? N  In the past six months, have you accidently leaked urine? Y  Comment hx of prostate cancer; so constantly wears a pad since his surgery 10-15 years ago.  Do you have problems with loss of bowel control? N  Managing your Medications? N  Managing your Finances? N  Housekeeping or managing your  Housekeeping? N  Some recent data might be hidden    Patient Education/ Literacy How often do you need to have someone help you when you read instructions, pamphlets, or other written materials from your doctor or pharmacy?: 1 - Never What is the last grade level you completed in school?: 10th grade (GED)  Exercise Current Exercise Habits: The patient does not participate in regular exercise at present, Exercise limited by: None identified  Diet Patient reports consuming 2 meals a day and 0 snack(s) a day Patient reports that his primary diet is: Regular Patient reports that she does not have regular access to food.   Depression Screen PHQ 2/9 Scores 01/20/2021 09/20/2018 10/13/2017 01/19/2017 12/22/2016 11/24/2016  PHQ - 2 Score 0 0 5 5 5 6   PHQ- 9 Score 3 - 20 14 16 21      Fall Risk Fall Risk  01/20/2021 09/20/2018  Falls in the past year? 1 1  Number falls in past yr: 1 1  Injury with Fall? 0 0  Risk for fall due to : History of fall(s) (No Data)  Risk for fall due to: Comment - cutting trees and tripping over the wood  Follow up Falls evaluation completed;Education provided;Falls prevention discussed Falls prevention discussed     Objective:  Christian Lambert seemed alert and oriented and he participated appropriately during our telephone  visit.  Blood Pressure Weight BMI  BP Readings from Last 3 Encounters:  10/22/20 137/68  10/08/20 138/61  09/09/20 (!) 122/53   Wt Readings from Last 3 Encounters:  10/22/20 176 lb (79.8 kg)  10/08/20 174 lb (78.9 kg)  07/12/19 167 lb (75.8 kg)   BMI Readings from Last 1 Encounters:  10/22/20 23.22 kg/m    *Unable to obtain current vital signs, weight, and BMI due to telephone visit type  Hearing/Vision  Danna did not seem to have difficulty with hearing/understanding during the telephone conversation Reports that he has had a formal eye exam by an eye care professional within the past year Reports that he has not had a formal hearing evaluation within the past year *Unable to fully assess hearing and vision during telephone visit type  Cognitive Function: 6CIT Screen 01/20/2021 09/20/2018  What Year? 0 points 0 points  What month? 0 points 0 points  What time? 0 points 0 points  Count back from 20 0 points 0 points  Months in reverse 0 points 0 points  Repeat phrase 0 points 2 points  Total Score 0 2   (Normal:0-7, Significant for Dysfunction: >8)  Normal Cognitive Function Screening: Yes   Immunization & Health Maintenance Record Immunization History  Administered Date(s) Administered   Fluad Quad(high Dose 65+) 10/05/2018   Influenza, High Dose Seasonal PF 11/26/2016   Influenza,inj,Quad PF,6+ Mos 11/24/2012, 11/09/2013, 12/11/2014, 12/05/2015, 10/13/2017   Influenza-Unspecified 10/22/2019, 10/21/2020   Moderna Sars-Covid-2 Vaccination 09/26/2019, 02/01/2020, 08/01/2020   Pfizer Covid-19 Vaccine Bivalent Booster 7yrs & up 10/21/2020   Pneumococcal Conjugate-13 12/11/2014   Pneumococcal Polysaccharide-23 11/24/2012   Tdap 10/16/2010, 11/24/2012, 10/22/2019   Varicella Zoster Immune Globulin 11/24/2012   Zoster Recombinat (Shingrix) 11/12/2018, 01/10/2019    Health Maintenance  Topic Date Due   TETANUS/TDAP  10/21/2029   Pneumonia Vaccine 31+ Years old   Completed   INFLUENZA VACCINE  Completed   COVID-19 Vaccine  Completed   Zoster Vaccines- Shingrix  Completed   HPV VACCINES  Aged Out       Assessment  This is a routine wellness examination for [employer].  Health Maintenance: Due or Overdue There are no preventive care reminders to display for this patient.   Christian Lambert does not need a referral for Community Assistance: Care Management:   no Social Work:    no Prescription Assistance:  no Nutrition/Diabetes Education:  no   Plan:  Personalized Goals  Goals Addressed               This Visit's Progress     Patient Stated (pt-stated)        Patient states that he would like to loose 10 lbs.       Personalized Health Maintenance & Screening Recommendations  Influenza vaccine  Lung Cancer Screening Recommended: no (Low Dose CT Chest recommended if Age 31-80 years, 30 pack-year currently smoking OR have quit w/in past 15 years) Hepatitis C Screening recommended: no HIV Screening recommended: no  Advanced Directives: Written information was not prepared per patient's request.  Referrals & Orders No orders of the defined types were placed in this encounter.   Follow-up Plan Follow-up with Silverio Decamp, MD as planned Medicare wellness visit in one year. AVS printed and mailed to the patient.   I have personally reviewed and noted the following in the patients chart:   Medical and social history Use of alcohol, tobacco or illicit drugs  Current medications and supplements Functional ability and status Nutritional status Physical activity Advanced directives List of other physicians Hospitalizations, surgeries, and ER visits in previous 12 months Vitals Screenings to include cognitive, depression, and falls Referrals and appointments  In addition, I have reviewed and discussed with Christian Lambert certain preventive protocols, quality metrics, and best practice recommendations. A written  personalized care plan for preventive services as well as general preventive health recommendations is available and can be mailed to the patient at his request.      Tinnie Gens, RN  01/20/2021

## 2021-01-21 ENCOUNTER — Other Ambulatory Visit: Payer: Self-pay

## 2021-01-21 ENCOUNTER — Ambulatory Visit (INDEPENDENT_AMBULATORY_CARE_PROVIDER_SITE_OTHER): Payer: Medicare Other | Admitting: Sports Medicine

## 2021-01-21 DIAGNOSIS — T17328S Food in larynx causing other injury, sequela: Secondary | ICD-10-CM

## 2021-01-21 DIAGNOSIS — K225 Diverticulum of esophagus, acquired: Secondary | ICD-10-CM | POA: Diagnosis not present

## 2021-01-21 DIAGNOSIS — R1310 Dysphagia, unspecified: Secondary | ICD-10-CM

## 2021-01-21 DIAGNOSIS — L308 Other specified dermatitis: Secondary | ICD-10-CM | POA: Diagnosis not present

## 2021-01-21 DIAGNOSIS — L309 Dermatitis, unspecified: Secondary | ICD-10-CM | POA: Insufficient documentation

## 2021-01-21 MED ORDER — TRIAMCINOLONE ACETONIDE 0.5 % EX CREA: 1.0000 "application " | TOPICAL_CREAM | Freq: Two times a day (BID) | CUTANEOUS | 3 refills | Status: AC

## 2021-01-21 NOTE — Assessment & Plan Note (Addendum)
[place] returns, he continues to have occasional episodes of dysphagia, as well as sensation of food getting stuck in his upper throat, occurs with various consistencies including water and solids, we ordered a modified barium swallow evaluation, the speech pathology team called him multiple times and ultimately he said he did not want the study done, he has now changed his mind so I will reorder the test.  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

## 2021-01-21 NOTE — Progress Notes (Addendum)
° ° °  Procedures performed today:    Procedure:  Cryodestruction of left cheek seborrheic keratosis Consent obtained and verified. Time-out conducted. Noted no overlying erythema, induration, or other signs of local infection. Completed without difficulty using Cryo-Gun. Advised to call if fevers/chills, erythema, induration, drainage, or persistent bleeding.  Independent interpretation of notes and tests performed by another provider:   None.  Brief History, Exam, Impression, and Recommendations:    Zenker's diverticulum Jrake returns, he continues to have occasional episodes of dysphagia, as well as sensation of food getting stuck in his upper throat, occurs with various consistencies including water and solids, we ordered a modified barium swallow evaluation, the speech pathology team called him multiple times and ultimately he said he did not want the study done, he has now changed his mind so I will reorder the test.  Update 02/20/2021: Shafer's swallow study shows a Zenker's diverticulum, we will get him referred out to have this fixed.  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 Dianah Field, M.D., ABFM., CAQSM. Primary Care and [place] Instructor of [place] [employer]

## 2021-01-21 NOTE — Assessment & Plan Note (Signed)
Adding topical triamcinolone

## 2021-01-24 NOTE — Addendum Note (Signed)
Addended by: Silverio Decamp on: 01/24/2021 01:49 PM   Modules accepted: Orders

## 2021-01-29 ENCOUNTER — Telehealth (HOSPITAL_COMMUNITY): Payer: Self-pay

## 2021-01-29 ENCOUNTER — Other Ambulatory Visit (HOSPITAL_COMMUNITY): Payer: Self-pay

## 2021-01-29 DIAGNOSIS — R059 Cough, unspecified: Secondary | ICD-10-CM

## 2021-01-29 DIAGNOSIS — R1310 Dysphagia, unspecified: Secondary | ICD-10-CM

## 2021-01-29 NOTE — Telephone Encounter (Signed)
Attempted to contact patient to schedule OP MBS - left voicemail. ?

## 2021-02-07 ENCOUNTER — Other Ambulatory Visit: Payer: Self-pay | Admitting: Sports Medicine

## 2021-02-07 DIAGNOSIS — F321 Major depressive disorder, single episode, moderate: Secondary | ICD-10-CM

## 2021-02-18 ENCOUNTER — Other Ambulatory Visit: Payer: Self-pay

## 2021-02-18 ENCOUNTER — Ambulatory Visit (HOSPITAL_COMMUNITY)
Admission: RE | Admit: 2021-02-18 | Discharge: 2021-02-18 | Disposition: A | Discharge status: Home / Self Care | Payer: Medicare Other | Source: Ambulatory Visit | Attending: Sports Medicine | Admitting: Sports Medicine

## 2021-02-18 ENCOUNTER — Ambulatory Visit: Payer: Medicare Other | Admitting: Sports Medicine

## 2021-02-18 DIAGNOSIS — R1310 Dysphagia, unspecified: Secondary | ICD-10-CM | POA: Insufficient documentation

## 2021-02-18 DIAGNOSIS — R059 Cough, unspecified: Secondary | ICD-10-CM | POA: Insufficient documentation

## 2021-02-18 NOTE — Progress Notes (Signed)
Objective Swallowing Evaluation: Type of Study: MBS-Modified Barium Swallow Study   Patient Details  Name: Christian Lambert MRN: [id number] Date of Birth: 10-08-1940  Today's Date: 02/18/2021 Time: SLP Start Time (ACUTE ONLY): 43 -SLP Stop Time (ACUTE ONLY): 6568  SLP Time Calculation (min) (ACUTE ONLY): 36 min   Past Medical History:  Past Medical History:  Diagnosis Date   Depression, major, single episode, moderate ([place]) 11/24/2016   11/24/2016 PHQ9 = 21, GAD7 = 12 12/22/2016 PHQ 9 = 16, GAD 7 = 11 01/19/2017 PHQ 9 = 14, GAD 7 = 6 02/18/2017 PHQ 9 = 9, GAD 7 = 2   Enteritis due to Clostridium difficile 11/09/2013   GERD (gastroesophageal reflux disease) 03/09/2017   Hyperlipidemia    Past Surgical History:  Past Surgical History:  Procedure Laterality Date   broken leg     PROSTATECTOMY     HPI: pt is an 81 yo male referred for OP MBS by Dr Dianah Field.  Pt with PMH + for B12 deficiency, depression, seborrheic dermatitis, macrocytosis without anemia, insomnia, hypercholesterolemia, hyperglycemia, poor vision and self reported reflux.  He advised he has issues with swallowing food and pills - sensing they lodge in his throat. He also reports that he will "aspirate" with liquids too.  He states liquids stick in his throat "all the way down".  Pt denies weight loss, requiring heimlich manuever nor pna.   Subjective: pt awake in chair    Recommendations for follow up therapy are one component of a multi-disciplinary discharge planning process, led by the attending physician.  Recommendations may be updated based on patient status, additional functional criteria and insurance authorization.  Assessment / Plan / Recommendation  Clinical Impressions 02/18/2021  Clinical Impression Patient presents with functional oropharyngeal swallow ability - No aspiration and inconsistent trace laryngeal penetration of thin liquids cleared with swallows.  Minimal retention of cracker bolus at tongue  base and vallecular space was cleared with reflexive dry swallows.  Patient does appear with a Zenkers diverticulum that trapped barium from initial swallow and did not fully clear throughout the study.   In A-P view, Zenkers appeared on left.  Pt was sensate to retention when present with solids - thus suspect this is patients source of dysphagia.      Recommend consider consult to ENT for potential surgical intervention given pt report of progression of symptoms.    In the interim, advised pt to masticate food thoroughly and to drink liquids throughout po - considering warm or carbonated to help clear if difficulty noted.  Using video loops, informed pt to recommendations - and he confirmed understanding. Thank you for this referral.  SLP Visit Diagnosis Dysphagia, pharyngoesophageal phase (R13.14)  Attention and concentration deficit following --  Frontal lobe and executive function deficit following --  Impact on safety and function Mild aspiration risk      No flowsheet data found.   No flowsheet data found.  Diet Recommendations 02/18/2021  SLP Diet Recommendations Regular solids;Dysphagia 3 (Mech soft) solids;Thin liquid  Liquid Administration via Cup;Straw  Medication Administration Other (Comment)  Compensations Slow rate;Small sips/bites  Postural Changes Remain semi-upright after after feeds/meals (Comment);Seated upright at 90 degrees      Other Recommendations 02/18/2021  Recommended Consults Consider ENT evaluation  Oral Care Recommendations --  Other Recommendations --  Follow Up Recommendations --  Assistance recommended at discharge --  Functional Status Assessment --    No flowsheet data found.    Oral Phase 02/18/2021  Oral Phase  Impaired  Oral - Pudding Teaspoon --  Oral - Pudding Cup --  Oral - Honey Teaspoon --  Oral - Honey Cup --  Oral - Nectar Teaspoon --  Oral - Nectar Cup WFL  Oral - Nectar Straw --  Oral - Thin Teaspoon --  Oral - Thin Cup WFL   Oral - Thin Straw WFL  Oral - Puree WFL  Oral - Mech Soft WFL;Piecemeal swallowing  Oral - Regular --  Oral - Multi-Consistency --  Oral - Pill WFL  Oral Phase - Comment --    Pharyngeal Phase 02/18/2021  Pharyngeal Phase WFL;Impaired  Pharyngeal- Pudding Teaspoon --  Pharyngeal --  Pharyngeal- Pudding Cup --  Pharyngeal --  Pharyngeal- Honey Teaspoon --  Pharyngeal --  Pharyngeal- Honey Cup --  Pharyngeal --  Pharyngeal- Nectar Teaspoon --  Pharyngeal --  Pharyngeal- Nectar Cup [facility]  Pharyngeal Material does not enter airway  Pharyngeal- Nectar Straw --  Pharyngeal --  Pharyngeal- Thin Teaspoon --  Pharyngeal --  Pharyngeal- Thin Cup [facility]  Pharyngeal Material does not enter airway  Pharyngeal- Thin Straw WFL;Penetration/Aspiration during swallow  Pharyngeal Material does not enter airway;Material enters airway, remains ABOVE vocal cords then ejected out  Pharyngeal- Puree WFL  Pharyngeal Material does not enter airway  Pharyngeal- Mechanical Soft Pharyngeal residue - valleculae;WFL;Delayed swallow initiation-vallecula  Pharyngeal Material does not enter airway  Pharyngeal- Regular --  Pharyngeal --  Pharyngeal- Multi-consistency --  Pharyngeal --  Pharyngeal- Pill WFL;Penetration/Aspiration during swallow  Pharyngeal Material does not enter airway;Material enters airway, remains ABOVE vocal cords then ejected out  Pharyngeal Comment trace laryngeal penetration x1 is WFL for pt's age, mild vallecular retention present after solid swallow, trace penetration of thin when taking barium tablet noted again [facility]     Cervical Esophageal Phase  02/18/2021  Cervical Esophageal Phase Impaired  Pudding Teaspoon --  Pudding Cup --  Honey Teaspoon --  Honey Cup --  Nectar Teaspoon --  Nectar Cup --  Nectar Straw --  Thin Teaspoon --  Thin Cup --  Thin Straw --  Puree --  Mechanical Soft --  Regular --  Multi-consistency --  Pill --  Cervical Esophageal Comment Pt appears  to have Zenker's diverticulum resulting in barium trapping at cervical esophagus and not clearing throughout entire MBS.  Pt with inconsistent awareness/sensation.  In A-P view, diverticulum appeared on left.   Kathleen Lime, MS [facility] SLP Acute Rehab Services Office [phone] Cell [phone]   Macario Golds 02/18/2021, 4:04 PM       SLP note placed in progress note area - pending radiologist documentation in imaging section.

## 2021-02-20 NOTE — Addendum Note (Signed)
Addended by: Silverio Decamp on: 02/20/2021 02:08 PM   Modules accepted: Orders

## 2021-03-28 ENCOUNTER — Telehealth: Payer: Self-pay | Admitting: Sports Medicine

## 2021-03-28 NOTE — Telephone Encounter (Signed)
Pt called 4:00 this afternoon complaining of infection in his mouth. Pain is so bad that he can't eat solid foods. Pt stated that he tested positive for Covid 3-4 days ago. Pt says he is need of antibiotic. Preferred pharmacy is [facility] [address] in [place].

## 2021-03-29 NOTE — Telephone Encounter (Signed)
Unfortunately antibiotics only work on bacterial infections, COVID is a virus and what sounds to be stomatitis is likely reactive.  Gargle salt water, take motrin and tylenol, and I need to see this to know what I'm actually treating.

## 2021-06-10 ENCOUNTER — Other Ambulatory Visit: Payer: Self-pay | Admitting: Sports Medicine

## 2021-06-10 DIAGNOSIS — F321 Major depressive disorder, single episode, moderate: Secondary | ICD-10-CM

## 2021-07-08 ENCOUNTER — Telehealth: Payer: Self-pay

## 2021-07-08 DIAGNOSIS — K225 Diverticulum of esophagus, acquired: Secondary | ICD-10-CM

## 2021-07-08 NOTE — Telephone Encounter (Signed)
New referral placed.

## 2021-07-08 NOTE — Telephone Encounter (Signed)
Patient called stating that he was ready to have the surgery to correct whatever was found on his swallow study back in January. Please send referral to appropriate specialist.

## 2021-07-08 NOTE — Telephone Encounter (Signed)
He saw Dr. Dimas Millin with [facility], he can just call them to make an appointment to discuss scheduling the surgery.

## 2021-07-08 NOTE — Assessment & Plan Note (Signed)
Meghan has been having occasional episodes of dysphagia, sensations of food getting stuck in the upper throat with various consistencies including water and solids, modified barium swallow evaluation and speech pathology team ultimately found to Zenker's diverticulum, he did get a consultation with Dr. Dimas Millin with [facility], would like a second opinion in [place].

## 2021-07-08 NOTE — Telephone Encounter (Signed)
Pt states that he does not want to go back to [facility] because the doctor there gave him COVID.  He would like a referral to a new ENT and he prefers to be seen in [facility].  Charyl Bigger, CMA

## 2021-07-09 ENCOUNTER — Other Ambulatory Visit: Payer: Self-pay | Admitting: Sports Medicine

## 2021-07-09 DIAGNOSIS — F321 Major depressive disorder, single episode, moderate: Secondary | ICD-10-CM

## 2021-08-26 ENCOUNTER — Other Ambulatory Visit: Payer: Self-pay

## 2021-08-26 MED ORDER — GEMFIBROZIL 600 MG PO TABS: 600.0000 mg | ORAL_TABLET | Freq: Two times a day (BID) | ORAL | 0 refills | Status: AC

## 2022-02-17 ENCOUNTER — Telehealth: Payer: Self-pay | Admitting: Gastroenterology

## 2022-02-17 NOTE — Telephone Encounter (Addendum)
Hi Dr. Candis Schatz,  Supervising Provider: 01/15/204   We received a referral for patient to be evaluated for zenker's diverticulum. I spoke with the patient and he stated he did see someone at [facility] in which he did not care for. The patient would like to continue his GI care with [facility] records are available via Epic [facility]) for you to review and advise on scheduling.   Thanks

## 2022-02-23 NOTE — Telephone Encounter (Signed)
Patient called to follow up, advised him on Dr. Dayle Points recommendation. Would like to proceed care with [facility] or [facility].

## 2023-08-09 IMAGING — RF DG SWALLOWING FUNCTION
13 series · 17 of 24 positions shown · non-contrast
Comparison: Neck CT 12/17/2014

CLINICAL DATA: Dysphagia.

EXAM:
MODIFIED BARIUM SWALLOW
TECHNIQUE: Different consistencies of barium were administered orally to the
patient by the Speech Pathologist. Imaging of the pharynx was
performed in the lateral projection. The radiologist was present in
the fluoroscopy room for this study, providing personal supervision.
FLUOROSCOPY TIME:  Radiation Exposure Index (if provided by the
fluoroscopic device): 5.1 mGy
Number of Acquired Spot Images: 0

[Series 1: cp_standard · 1 of 10 frames shown (1 of 13)]
[frame 2/10]
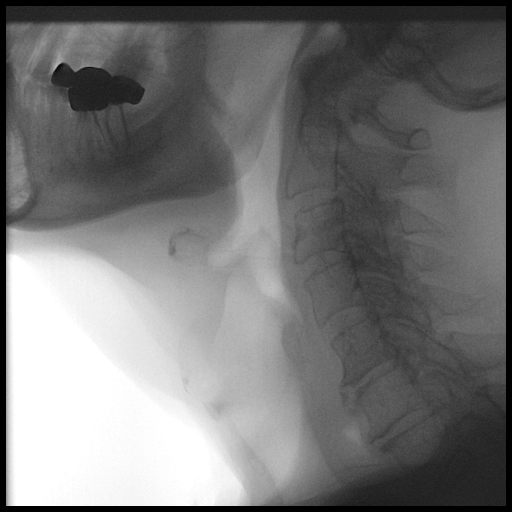

[Series 2: cp_standard · 2 of 87 frames shown (2 of 13)]
[frame 14/87]
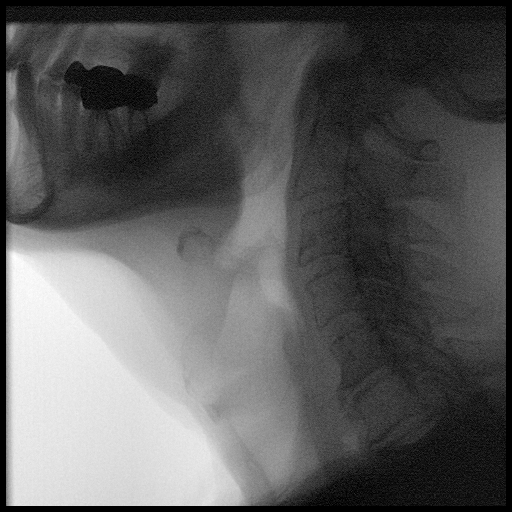
[frame 74/87]
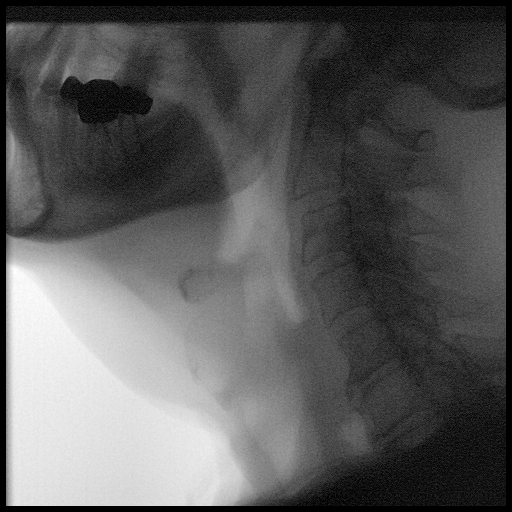

[Series 3: cp_standard · 1 of 296 frames shown (3 of 13)]
[frame 252/296]
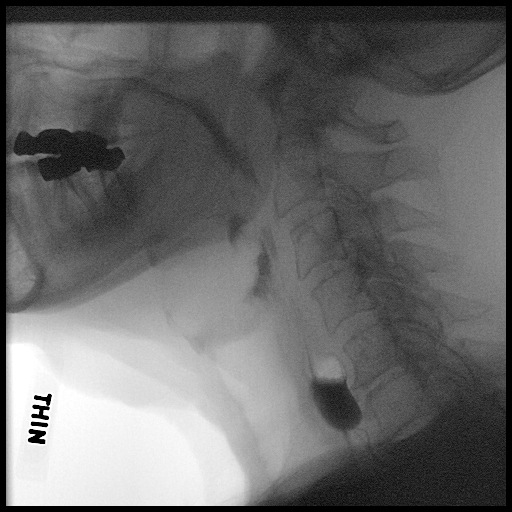

[Series 4: cp_standard · 1 of 138 frames shown (4 of 13)]
[frame 118/138]
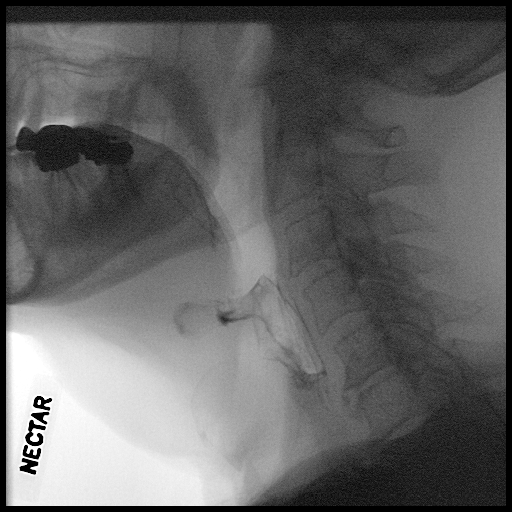

[Series 5: cp_standard · 1 of 147 frames shown (5 of 13)]
[frame 23/147]
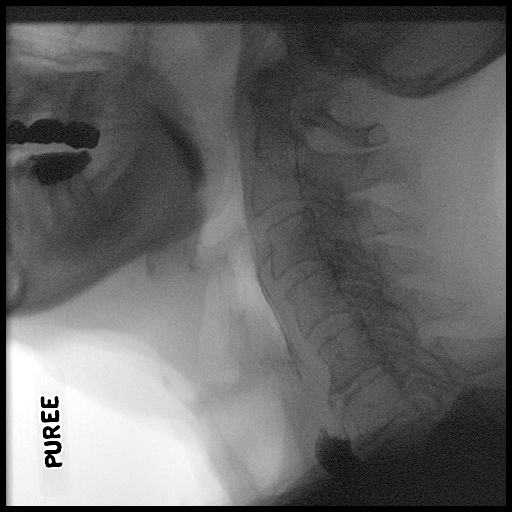

[Series 6: cp_standard · 2 of 416 frames shown (6 of 13)]
[frame 63/416]
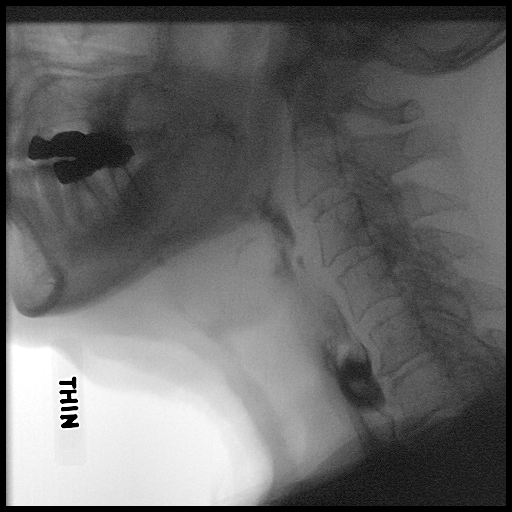
[frame 209/416]
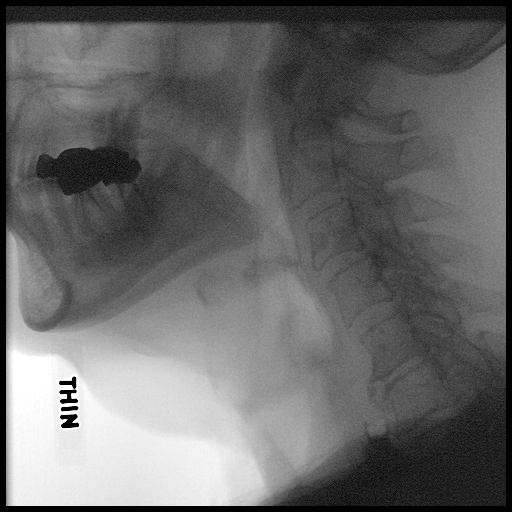

[Series 7: cp_standard · 1 of 218 frames shown (7 of 13)]
[frame 186/218]
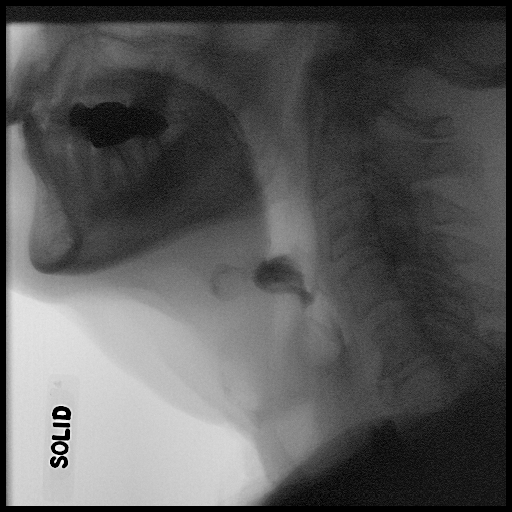

[Series 8: cp_standard · 2 of 307 frames shown (8 of 13)]
[frame 63/307]
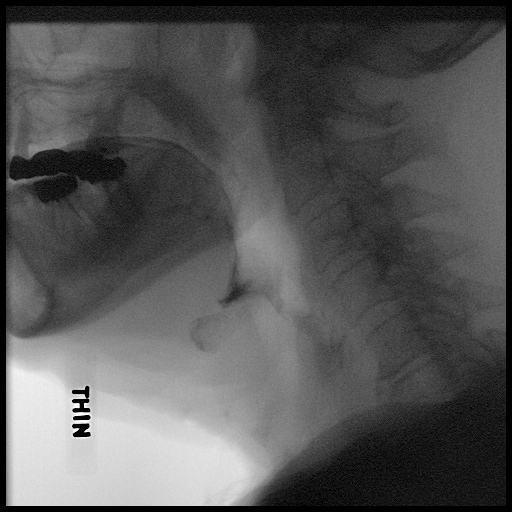
[frame 261/307]
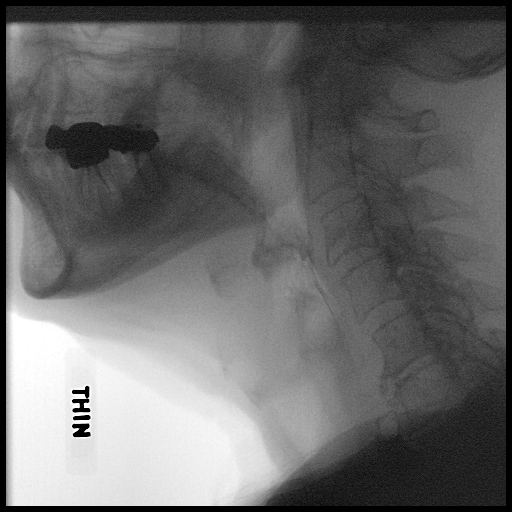

[Series 9: cp_standard · 1 of 88 frames shown (9 of 13)]
[frame 75/88]
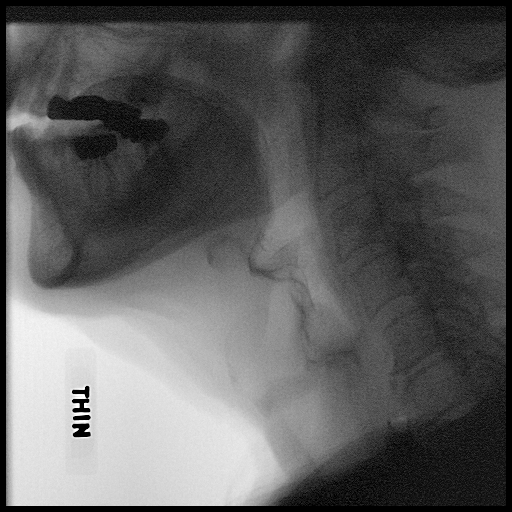

[Series 10: cp_standard · 1 of 294 frames shown (10 of 13)]
[frame 104/294]
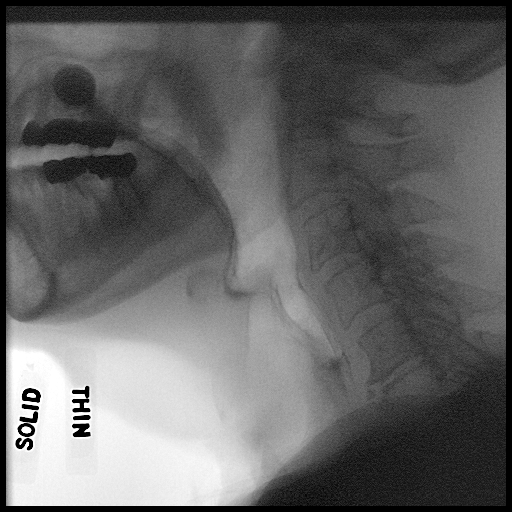

[Series 11: cp_standard · 1 of 67 frames shown (11 of 13)]
[frame 11/67]
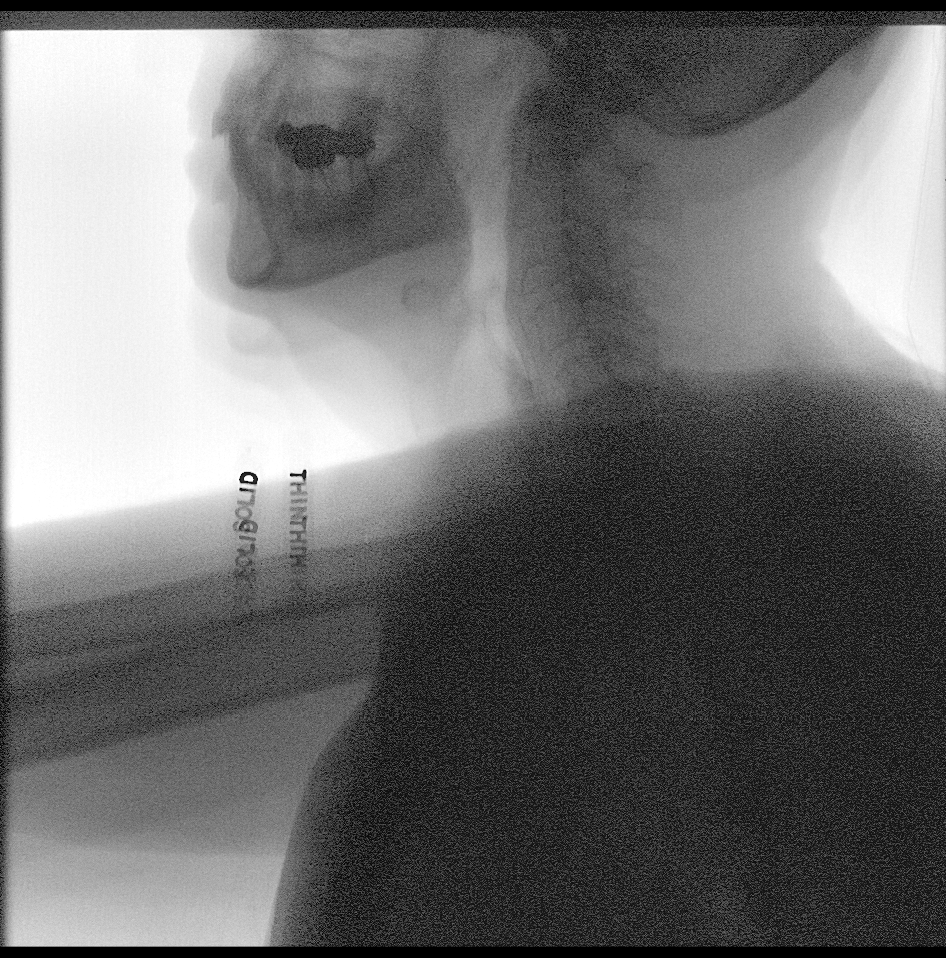

[Series 12: cp_standard · 2 of 13 frames shown (12 of 13)]
[frame 2/13]
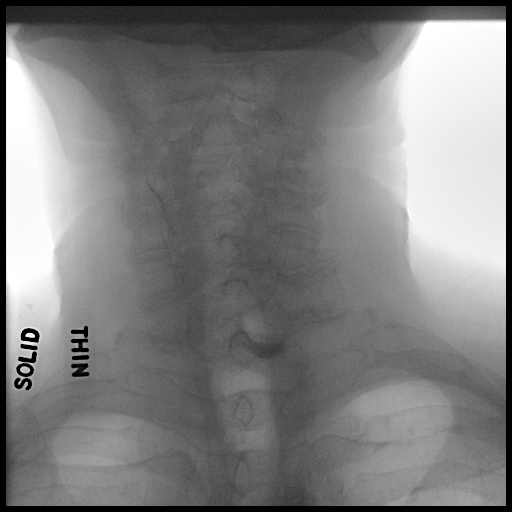
[frame 12/13]
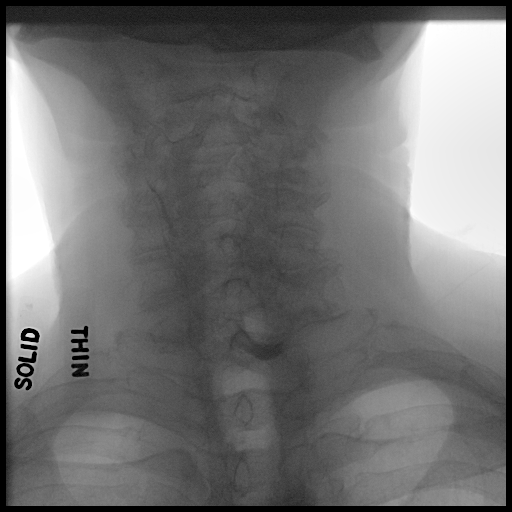

[Series 13: cp_standard · 1 of 386 frames shown (13 of 13)]
[frame 341/386]
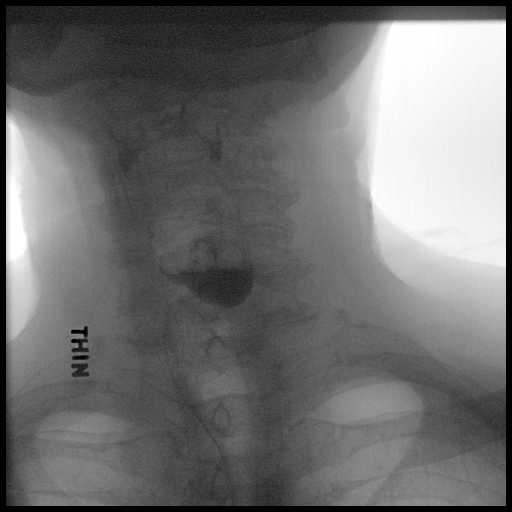

[17 of 24 positions shown; findings below may reference images not displayed]

FINDINGS: Please see speech pathology notes. A retentive eccentric Guggisberg
Giorgi Jumper diverticulum is identified.
IMPRESSION: Tiff diverticulum.

Please refer to the Speech Pathologists report for complete details
and recommendations.

## 2023-10-05 ENCOUNTER — Encounter: Payer: Self-pay | Admitting: Sports Medicine
